# Patient Record
Sex: Female | Born: 1990 | Race: Black or African American | Hispanic: No | Smoking: Never smoker
Health system: Southern US, Community
[De-identification: ages and names within clinical notes are randomized; demographics above are authoritative.]

## PROBLEM LIST (undated history)

## (undated) ENCOUNTER — Emergency Department: Discharge status: Absent without leave | Payer: Self-pay

## (undated) DIAGNOSIS — F419 Anxiety disorder, unspecified: Secondary | ICD-10-CM

## (undated) DIAGNOSIS — O24419 Gestational diabetes mellitus in pregnancy, unspecified control: Secondary | ICD-10-CM

## (undated) DIAGNOSIS — S42309A Unspecified fracture of shaft of humerus, unspecified arm, initial encounter for closed fracture: Secondary | ICD-10-CM

## (undated) DIAGNOSIS — I10 Essential (primary) hypertension: Secondary | ICD-10-CM

## (undated) DIAGNOSIS — N979 Female infertility, unspecified: Secondary | ICD-10-CM

## (undated) DIAGNOSIS — F32A Depression, unspecified: Secondary | ICD-10-CM

## (undated) DIAGNOSIS — E282 Polycystic ovarian syndrome: Secondary | ICD-10-CM

## (undated) HISTORY — DX: Gestational diabetes mellitus in pregnancy, unspecified control: O24.419

## (undated) HISTORY — DX: Essential (primary) hypertension: I10

## (undated) HISTORY — DX: Unspecified fracture of shaft of humerus, unspecified arm, initial encounter for closed fracture: S42.309A

## (undated) HISTORY — DX: Anxiety disorder, unspecified: F41.9

## (undated) HISTORY — PX: NO PAST SURGERIES: SHX2092

## (undated) HISTORY — DX: Depression, unspecified: F32.A

---
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
## 2011-02-07 NOTE — Assessment & Plan Note (Signed)
[name], [name]:  [id number]   MEDICAL RECORD NO.:  [id number]          PATIENT TYPE:  POB   LOCATION:  [facility] [place]         FACILITY:  [facility]   PHYSICIAN:  Caren Griffins, CNM       DATE OF BIRTH:  [birth year]   DATE OF SERVICE:  08/08/2010                                  CLINIC NOTE   REASON FOR VISIT:  Here for refill of her birth control pills.   HISTORY:  This is an 20 year old nulliparous AA female who does not have  a primary care Alyssa Sparks other than [facility] and is in need  of a refill on Ortho Tri-Cyclen.  She is happy with the and Ortho Tri-  Cyclen which she is using for contraception as well as to regulate her  periods and discussing her weight and borderline blood pressure, she  states 203 is the most she has ever weighed, and she would like to get  back to her more normal weight of 180 pounds.  For this reason, she has  joined a weekly exercise class at her church and is walking as much as  possible with her mother.  She states her blood pressure has never been  elevated in the past and she has no concerns other than getting her  refill.   ALLERGIES:  None.   MEDICATIONS:  None.   HEALTH CARE MAINTENANCE:  She has had the usual childhood immunizations  and does get dental care.   Her menstrual history 9 x irregular x 5 light flow, moderate  dysmenorrhea.  She gives a history of initially having monthly cycles  and then for several years having menses either every other month or  every 3 months.  For this reason, she was started on Ortho Cyclen.  She  has had 2 partners in the past years, never been on any other kind of  birth control.   GYN HISTORY:  Never had a Pap, never had an STD.   PERSONAL MEDICAL HISTORY:  Completely negative.  Denies hospitalizations  or significant illnesses.  She has never had surgery.  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  She lives with her mother.  She is a  nonsmoker.  Does not drink alcohol or use any drugs.  No  history of IV drug use or physical abuse.   FAMILY HISTORY:  Positive for mother and both maternal grandparents with  diabetes and as well as high blood pressure.  Her great grandmother had  breast cancer, and her grandmother has had blood clots in the legs.   REVIEW OF SYSTEMS:  Completely negative other than her weight gain.   PHYSICAL EXAMINATION:  VITAL SIGNS:  BP 130/86, pulse 91, weight 203,  height 5 feet 7 inches.  GENERAL:  Pleasant WN, WD, in NAD.  HEAD:  Normocephalic.  Good dentition.  NECK:  Thyroid normal size and shape, smooth.  HEART:  RRR without murmur.  LUNGS:  CTA bilateral.  BREASTS:  Very large.  No discrete mass, but exam is difficult.  No  lymphadenopathy.  ABDOMEN:  Protuberant, no tenderness or masses noted.  EXTREMITIES:  Pulses full and equal bilateral.  No edema.  SKIN:  No lesions noted.  PELVIC:  Deferred.   LABORATORY DATA:  We will check GC and Chlamydia on the urine.   ASSESSMENT AND PLAN.:  1. Obesity.  Discussed weight loss goals and to avoid any empty      calorie foods.  To eat a prudent diet.  We went over the need for      regularity in her exercise program.  2. Borderline blood pressure.  We will bring her back in 3 months      after she starts her Ortho Cyclen again and recheck her blood      pressure at that time.  3. Contraception.  Today, we will refill her Ortho Cyclen, as she is      happy with that and she is advised to abstain from sex for 2 weeks      then get a pregnancy test and if that is negative she can go ahead      and begin contraceptives.  Also discussed safe sex and in addition      advised her to continue using the seatbelt every time she is in a      car.           ______________________________  Caren Griffins, CNM     DP/MEDQ  D:  08/08/2010  T:  08/09/2010  Job:  [id number]

## 2011-09-09 ENCOUNTER — Emergency Department
Admission: EM | Admit: 2011-09-09 | Discharge: 2011-09-09 | Disposition: A | Discharge status: Home / Self Care | Payer: BC Managed Care – PPO | Source: Home / Self Care | Attending: Emergency Medicine | Admitting: Emergency Medicine

## 2011-09-09 NOTE — ED Notes (Signed)
WAS NOT SEEN

## 2011-09-09 NOTE — ED Provider Notes (Signed)
History     CSN: [id number] Arrival date & time: No admission date for patient encounter.   First MD Initiated Contact with Patient 09/09/11 1516      No chief complaint on file.   (Consider location/radiation/quality/duration/timing/severity/associated sxs/prior treatment) HPI  No past medical history on file.  No past surgical history on file.  No family history on file.  History  Substance Use Topics  . Smoking status: Not on file  . Smokeless tobacco: Not on file  . Alcohol Use: Not on file    OB History    No data available      Review of Systems  Allergies  Review of patient's allergies indicates not on file.  Home Medications  No current outpatient prescriptions on file.  There were no vitals taken for this visit.  Physical Exam  ED Course  Procedures (including critical care time)  Labs Reviewed - No data to display No results found.   No diagnosis found.    MDM   Patient left prior to being seen.  Lily Kocher, MD 09/09/11 [phone]

## 2012-01-24 ENCOUNTER — Encounter: Payer: Self-pay | Admitting: Obstetrics & Gynecology

## 2012-01-24 ENCOUNTER — Ambulatory Visit (INDEPENDENT_AMBULATORY_CARE_PROVIDER_SITE_OTHER): Payer: BC Managed Care – PPO | Admitting: Obstetrics & Gynecology

## 2012-01-24 VITALS — BP 118/76 | HR 69 | Temp 97.3°F | Ht 67.0 in | Wt 216.1 lb

## 2012-01-24 DIAGNOSIS — N920 Excessive and frequent menstruation with regular cycle: Secondary | ICD-10-CM

## 2012-01-24 DIAGNOSIS — N921 Excessive and frequent menstruation with irregular cycle: Secondary | ICD-10-CM | POA: Insufficient documentation

## 2012-01-24 LAB — POCT URINE PREGNANCY: Preg Test, Ur: NEGATIVE

## 2012-01-24 MED ORDER — NORGESTIMATE-ETH ESTRADIOL 0.25-35 MG-MCG PO TABS: 1.0000 | ORAL_TABLET | Freq: Every day | ORAL | Status: DC

## 2012-01-24 MED ORDER — MEDROXYPROGESTERONE ACETATE 10 MG PO TABS: 10.0000 mg | ORAL_TABLET | Freq: Every day | ORAL | Status: DC

## 2012-01-24 NOTE — Patient Instructions (Signed)
Oral Contraception Use Oral contraceptives (OCs) are medicines taken to prevent pregnancy. OCs work by preventing the ovaries from releasing eggs. The hormones in OCs also cause the cervical mucus to thicken, preventing the sperm from entering the uterus. The hormones also cause the uterine lining to become thin, not allowing a fertilized egg to attach to the inside of the uterus. OCs are highly effective when taken exactly as prescribed. However, OCs do not prevent sexually transmitted diseases (STDs). Safe sex practices, such as using condoms along with an OC, can help prevent STDs.  Before taking OCs, you may have a physical exam and Pap test. Your caregiver may also order blood tests if necessary. Your caregiver will make sure you are a good candidate for oral contraception. Discuss with your caregiver the possible side effects of the OC you may be prescribed. When starting an OC, it can take 2 to 3 months for the body to adjust to the changes in hormone levels in your body.  HOW TO TAKE ORAL CONTRACEPTIVES Your caregiver may advise you on how to start taking the first cycle of OCs. Otherwise, you can:  Start on day 1 of your menstrual period. You will not need any backup contraceptive protection with this start time.   Start on the first Sunday after your menstrual period or the day you get your prescription. In these cases, you will need to use backup contraceptive protection for the first 7-day cycle.  After you have started taking OCs:  If you forget to take 1 pill, take it as soon as you remember. Take the next pill at the regular time.   If you miss 2 or more pills, use backup birth control until your next menstrual period starts.   If you use a 28-day pack that contains inactive pills and you miss 1 of the last 7 pills (pills with no hormones), it will not matter. Throw away the rest of the non-hormone pills and start a new pill pack.  No matter which day you start the OC, you will always  start a new pack on that same day of the week. Have an extra pack of OCs and a backup contraceptive method available in case you miss some pills or lose your OC pack. HOME CARE INSTRUCTIONS   Do not smoke.   Always use a condom to protect against STDs. OCs do not protect against STDs.   Use a calendar to mark your menstrual period days.   Read the information and directions that come with your OC. Talk to your caregiver if you have questions.  SEEK MEDICAL CARE IF:   You develop nausea and vomiting.   You have abnormal vaginal discharge or bleeding.   You develop a rash.   You miss your menstrual period.   You are losing your hair.   You need treatment for mood swings or depression.   You get dizzy when taking the OC.   You develop acne from taking the OC.   You become pregnant.  SEEK IMMEDIATE MEDICAL CARE IF:   You develop chest pain.   You develop shortness of breath.   You have an uncontrolled or severe headache.   You develop numbness or slurred speech.   You develop visual problems.   You develop pain, redness, and swelling in the legs.  Document Released: 08/31/2011 Document Reviewed: 08/29/2011 Exit[employer] Patient Information 2012 [employer]. 

## 2012-01-25 NOTE — Progress Notes (Signed)
  Subjective:     Alyssa Sparks is a 21 y.o. woman who presents for irregular menses. Patient's last menstrual period was 12/31/2011. Marland Kitchen Periods are irregular, lasting 7-20 days. Dysmenorrhea:none. Cyclic symptoms include: none. Current contraception: none.History of infertility: no. History of abnormal Pap smear: no.  The following portions of the patient's history were reviewed and updated as appropriate: allergies, current medications, past family history, past medical history, past social history, past surgical history and problem list.  Review of Systems A comprehensive review of systems was negative except for: concerned about weight.       Objective:    No exam performed today, No exam indicated.  Pt had recent pelvic US.  Pt no longer taking OCPs and she began to have irregular cycles .    Assessment:    The patient has metrorrhagia (throughout cycle).    Plan:    Diagnosis explained in detail. All questions answered. Agricultural engineer distributed. Pregnancy test, result: negative Restart OCPs  after 10 days of Provera and withdrawal bleed.  (US shows thick linicing at 13 mm) Discussed nuva ring, depo provera, and nexplanon. Pt encouraged to lose weight and follow up with PCP for pre diabetes.

## 2012-03-04 ENCOUNTER — Ambulatory Visit (INDEPENDENT_AMBULATORY_CARE_PROVIDER_SITE_OTHER): Payer: BC Managed Care – PPO | Admitting: Obstetrics and Gynecology

## 2012-03-04 ENCOUNTER — Encounter: Payer: Self-pay | Admitting: Obstetrics and Gynecology

## 2012-03-04 VITALS — BP 120/74 | HR 80 | Ht 66.0 in | Wt 210.0 lb

## 2012-03-04 DIAGNOSIS — N949 Unspecified condition associated with female genital organs and menstrual cycle: Secondary | ICD-10-CM

## 2012-03-04 DIAGNOSIS — N920 Excessive and frequent menstruation with regular cycle: Secondary | ICD-10-CM

## 2012-03-04 DIAGNOSIS — R102 Pelvic and perineal pain: Secondary | ICD-10-CM

## 2012-03-04 DIAGNOSIS — N926 Irregular menstruation, unspecified: Secondary | ICD-10-CM

## 2012-03-04 LAB — POCT URINALYSIS DIPSTICK
Glucose, UA: NEGATIVE
Nitrite, UA: NEGATIVE
Spec Grav, UA: >=1.03
Urobilinogen, UA: NEGATIVE

## 2012-03-04 LAB — POCT URINE PREGNANCY: Preg Test, Ur: NEGATIVE

## 2012-03-04 NOTE — Progress Notes (Signed)
Regular Periods: yes;THEY JUST LAST LONG;WAS TOLD PT HAS THICK ENDOMETRIUM Mammogram: no  Monthly Breast Ex.: yes Exercise: yes  Tetanus < 10 years: yes Seatbelts: yes  NI. Bladder Functn.: yes Abuse at home: no  Daily BM's: yes Stressful Work: yes  Healthy Diet: yes Sigmoid-Colonoscopy: NO  Calcium: no Medical problems this year: CYCLE PROBLEMS   LAST PAP:2012 NL  Contraception: CONDOMS  Mammogram:  NO  PCP: DR. KOSKINAS  PMH:NO CHANGE  FMH: NO CHANGE  Last Bone Scan: NO

## 2012-03-04 NOTE — Progress Notes (Signed)
When did bleeding start: FEB. 14 How  Long: OFF AND ON;STOP COUPLE OF DAYS AND THEN LAST A COUPLE OF WEEKS How often changing pad/tampon: EVERY COUPLE OF HOURS;LAST WEEK;EVERY HOUR Bleeding Disorders: no Contraception: no WAS ON OTC;STOP ON HER OWN Fibroids: no Hormone Therapy: no New Medications: no Menopausal Symptoms: no Vag. Discharge: no Abdominal Pain: yes Increased Stress: yes

## 2012-03-04 NOTE — Progress Notes (Signed)
20 YO with a history of prolonged bleeding starting 11/09/2011 off and on.  May last weeks at a time with pad change every 1-2 hours. Also reports cramping 5/10 relieved with Tylenol or Ibuprofen.  When patient was not on contraceptives, she has skipped up to 2 months having her period.  Had an ultrasound of the pelvis at [facility] with normal findings except 13 mm endometrium.  Took 10 days of Provera 10 mg then began Ortho Cyclen with heavier bleeding.  Tooke the Ortho Cyclen x 2.5 weeks then stopped them.  Bleeding stopped last week (since June 3rd).  Still has back pain and cramping 3/10.  Cramping is worse with lifting.   Lastly patient wants to be considered for a breast reduction. Has been wearing a triple D cup for many years. Complains of upper and lower back pain that limits her ability at work. Patient has to take Tylenol and/or Ibuprofen every morning when she leaves work.  O: Neck: supple, no masses or thyromegaly          Lungs: clear      Heart: regular rate and rhythm       Abdomen: soft, non-tender       Pelvic: EGBUS-wnl, vagina-normal, cervix-no lesions,       uterus-normal size (exam limited by habitus), adnexae-      no tenderness   A: Menorrhagia     H/O Oligomenorrhea     Symptomatic Pendulous Breast  P: ROI lab results-[facility]      If not done, will do fasting insulin, glucose, TSH     and CBC      RTO- as scheduled or prn

## 2012-04-02 ENCOUNTER — Encounter: Payer: Self-pay | Admitting: *Deleted

## 2012-04-02 ENCOUNTER — Emergency Department
Admission: EM | Admit: 2012-04-02 | Discharge: 2012-04-02 | Disposition: A | Discharge status: Home / Self Care | Payer: BC Managed Care – PPO | Source: Home / Self Care

## 2012-04-02 DIAGNOSIS — S0500XA Injury of conjunctiva and corneal abrasion without foreign body, unspecified eye, initial encounter: Secondary | ICD-10-CM

## 2012-04-02 DIAGNOSIS — S058X9A Other injuries of unspecified eye and orbit, initial encounter: Secondary | ICD-10-CM

## 2012-04-02 MED ORDER — OFLOXACIN 0.3 % OP SOLN: 1.0000 [drp] | Freq: Four times a day (QID) | OPHTHALMIC | Status: AC

## 2012-04-02 NOTE — ED Notes (Signed)
Pt c/o bilateral eye pain and watering x 2 hours, after waking up from a nap with her contacts in her eyes. Denies fever.
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
## 2012-04-05 NOTE — ED Provider Notes (Signed)
Agree with exam, assessment, and plan.   Lattie Haw, MD 04/05/12 [phone]

## 2012-04-26 ENCOUNTER — Emergency Department (HOSPITAL_COMMUNITY)
Admission: EM | Admit: 2012-04-26 | Discharge: 2012-04-26 | Disposition: A | Discharge status: Home / Self Care | Payer: No Typology Code available for payment source | Attending: Emergency Medicine | Admitting: Emergency Medicine

## 2012-04-26 ENCOUNTER — Encounter (HOSPITAL_COMMUNITY): Payer: Self-pay | Admitting: Emergency Medicine

## 2012-04-26 ENCOUNTER — Emergency Department (HOSPITAL_COMMUNITY): Payer: No Typology Code available for payment source

## 2012-04-26 DIAGNOSIS — Y92410 Unspecified street and highway as the place of occurrence of the external cause: Secondary | ICD-10-CM | POA: Insufficient documentation

## 2012-04-26 DIAGNOSIS — M25559 Pain in unspecified hip: Secondary | ICD-10-CM | POA: Insufficient documentation

## 2012-04-26 DIAGNOSIS — M542 Cervicalgia: Secondary | ICD-10-CM | POA: Insufficient documentation

## 2012-04-26 MED ORDER — ACETAMINOPHEN 500 MG PO TABS
1000.0000 mg | ORAL_TABLET | Freq: Once | ORAL | Status: AC
  Administered 2012-04-26: 1000 mg via ORAL
  Filled 2012-04-26: qty 2
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
## 2012-05-30 ENCOUNTER — Encounter: Payer: Self-pay | Admitting: Obstetrics and Gynecology

## 2012-05-30 ENCOUNTER — Ambulatory Visit (INDEPENDENT_AMBULATORY_CARE_PROVIDER_SITE_OTHER): Payer: BC Managed Care – PPO | Admitting: Obstetrics and Gynecology

## 2012-05-30 VITALS — BP 120/70 | HR 74 | Wt 214.0 lb

## 2012-05-30 DIAGNOSIS — N912 Amenorrhea, unspecified: Secondary | ICD-10-CM

## 2012-05-30 DIAGNOSIS — N915 Oligomenorrhea, unspecified: Secondary | ICD-10-CM

## 2012-05-30 DIAGNOSIS — N920 Excessive and frequent menstruation with regular cycle: Secondary | ICD-10-CM

## 2012-05-30 LAB — POCT URINE PREGNANCY: Preg Test, Ur: NEGATIVE

## 2012-05-30 MED ORDER — MEDROXYPROGESTERONE ACETATE 10 MG PO TABS: 10.0000 mg | ORAL_TABLET | Freq: Every day | ORAL | Status: AC
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
## 2012-06-04 ENCOUNTER — Other Ambulatory Visit: Payer: BC Managed Care – PPO

## 2012-06-04 DIAGNOSIS — N912 Amenorrhea, unspecified: Secondary | ICD-10-CM

## 2012-06-04 LAB — PROLACTIN: Prolactin: 11.4 ng/mL

## 2012-06-04 LAB — GLUCOSE, RANDOM: Glucose, Bld: 86 mg/dL (ref 70–99)

## 2012-06-04 LAB — TSH: TSH: 1.415 u[IU]/mL (ref 0.350–4.500)

## 2012-06-05 ENCOUNTER — Telehealth: Payer: Self-pay | Admitting: Obstetrics and Gynecology

## 2012-06-05 NOTE — Telephone Encounter (Signed)
TC to pt. LM to return call.  

## 2012-06-06 ENCOUNTER — Telehealth: Payer: Self-pay

## 2012-06-06 LAB — INSULIN, FASTING: Insulin fasting, serum: 47 u[IU]/mL — ABNORMAL HIGH (ref 3–28)

## 2012-06-06 NOTE — Telephone Encounter (Signed)
Message copied by Winfred Leeds on Thu Jun 06, 2012  8:39 AM ------      Message from: Henreitta Leber      Created: Wed Jun 05, 2012  2:59 PM       Advise patient of normal thyroid and glucose tests however insulin level is high and glucose/insulin ratio low indicating that she is insulin resistant. She may schedule to further discuss management as this directly impacts her irregular bleeding and can cause weight gain.  Thanks,  EP

## 2012-06-06 NOTE — Telephone Encounter (Signed)
TC TO PT REGARDING LAB TEST THAT ARE MENTION BELOW. INFORMED PT ABOUT INSULIN LEVEL INDICATING THAT SHE IS INSULIN RESISTANT AND SCHEDULED AN APPT ON 06/10/12 AT 9:00 AM TO FURTHER DISCUSS. PT VOICED UNDERSTANDING.

## 2012-06-10 ENCOUNTER — Encounter: Payer: Self-pay | Admitting: Obstetrics and Gynecology

## 2012-06-10 ENCOUNTER — Ambulatory Visit (INDEPENDENT_AMBULATORY_CARE_PROVIDER_SITE_OTHER): Payer: BC Managed Care – PPO | Admitting: Obstetrics and Gynecology

## 2012-06-10 VITALS — BP 114/70 | HR 74 | Wt 219.0 lb

## 2012-06-10 DIAGNOSIS — N912 Amenorrhea, unspecified: Secondary | ICD-10-CM

## 2012-06-10 DIAGNOSIS — E8881 Metabolic syndrome: Secondary | ICD-10-CM

## 2012-06-10 MED ORDER — METFORMIN HCL 500 MG PO TABS: ORAL_TABLET | ORAL | Status: DC

## 2012-06-10 NOTE — Progress Notes (Signed)
21 YO with a history of menometrorrhagia and recent labs showing insulin resistance returns to discuss treatment.   A: Insulin Resistance     Amenorrhea/Oligomenorrhea/Menometrorrhagis     P: Reviewed management options to include: cyclical progestins,  BCPs, Nutritional counseling for dietary modification with exercise and Metformin      Patient chooses to try Metformin.  Aware of possible GI upset       Advised Low CHO diet and ask chiropractor for exercises that won't injure back      Call if no menses in 10 days (just completed Provera)       RTO-3 months for repeat labs and follow up

## 2012-10-10 ENCOUNTER — Encounter (HOSPITAL_BASED_OUTPATIENT_CLINIC_OR_DEPARTMENT_OTHER): Payer: Self-pay | Admitting: *Deleted

## 2012-10-10 ENCOUNTER — Emergency Department (HOSPITAL_BASED_OUTPATIENT_CLINIC_OR_DEPARTMENT_OTHER): Payer: BC Managed Care – PPO

## 2012-10-10 ENCOUNTER — Emergency Department (HOSPITAL_BASED_OUTPATIENT_CLINIC_OR_DEPARTMENT_OTHER)
Admission: EM | Admit: 2012-10-10 | Discharge: 2012-10-10 | Disposition: A | Discharge status: Home / Self Care | Payer: BC Managed Care – PPO | Attending: Emergency Medicine | Admitting: Emergency Medicine

## 2012-10-10 DIAGNOSIS — Z8742 Personal history of other diseases of the female genital tract: Secondary | ICD-10-CM | POA: Insufficient documentation

## 2012-10-10 DIAGNOSIS — Z79899 Other long term (current) drug therapy: Secondary | ICD-10-CM | POA: Insufficient documentation

## 2012-10-10 DIAGNOSIS — Z8781 Personal history of (healed) traumatic fracture: Secondary | ICD-10-CM | POA: Insufficient documentation

## 2012-10-10 DIAGNOSIS — Z3202 Encounter for pregnancy test, result negative: Secondary | ICD-10-CM | POA: Insufficient documentation

## 2012-10-10 DIAGNOSIS — R0789 Other chest pain: Secondary | ICD-10-CM

## 2012-10-10 DIAGNOSIS — R071 Chest pain on breathing: Secondary | ICD-10-CM | POA: Insufficient documentation

## 2012-10-10 HISTORY — DX: Polycystic ovarian syndrome: E28.2

## 2012-10-10 LAB — URINALYSIS, ROUTINE W REFLEX MICROSCOPIC
Bilirubin Urine: NEGATIVE
Hgb urine dipstick: NEGATIVE
Protein, ur: NEGATIVE mg/dL
Urobilinogen, UA: 1 mg/dL (ref 0.0–1.0)

## 2012-10-10 MED ORDER — NAPROXEN SODIUM 550 MG PO TABS: 500.0000 mg | ORAL_TABLET | Freq: Two times a day (BID) | ORAL | Status: DC
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
## 2012-10-10 NOTE — ED Notes (Signed)
Pain across her upper chest for a week on and off. No cold symptoms.
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
## 2015-10-14 ENCOUNTER — Encounter: Payer: Self-pay | Admitting: Family Medicine

## 2015-10-14 ENCOUNTER — Ambulatory Visit (INDEPENDENT_AMBULATORY_CARE_PROVIDER_SITE_OTHER): Payer: BLUE CROSS/BLUE SHIELD | Admitting: Family Medicine

## 2015-10-14 VITALS — BP 120/65 | HR 85 | Ht 67.0 in | Wt 235.0 lb

## 2015-10-14 DIAGNOSIS — R319 Hematuria, unspecified: Secondary | ICD-10-CM | POA: Diagnosis not present

## 2015-10-14 DIAGNOSIS — R350 Frequency of micturition: Secondary | ICD-10-CM

## 2015-10-14 DIAGNOSIS — M546 Pain in thoracic spine: Secondary | ICD-10-CM

## 2015-10-14 DIAGNOSIS — N926 Irregular menstruation, unspecified: Secondary | ICD-10-CM

## 2015-10-14 DIAGNOSIS — N62 Hypertrophy of breast: Secondary | ICD-10-CM

## 2015-10-14 DIAGNOSIS — E282 Polycystic ovarian syndrome: Secondary | ICD-10-CM | POA: Diagnosis not present

## 2015-10-14 DIAGNOSIS — M549 Dorsalgia, unspecified: Secondary | ICD-10-CM

## 2015-10-14 HISTORY — DX: Polycystic ovarian syndrome: E28.2

## 2015-10-14 LAB — POCT URINALYSIS DIPSTICK
BILIRUBIN UA: NEGATIVE
Glucose, UA: NEGATIVE
Ketones, UA: NEGATIVE
Leukocytes, UA: NEGATIVE
NITRITE UA: NEGATIVE
Protein, UA: NEGATIVE
UROBILINOGEN UA: 0.2
pH, UA: 6

## 2015-10-14 LAB — POCT GLYCOSYLATED HEMOGLOBIN (HGB A1C): HEMOGLOBIN A1C: 5.5

## 2015-10-14 LAB — POCT URINE PREGNANCY: PREG TEST UR: NEGATIVE
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
## 2015-10-15 LAB — URINALYSIS, MICROSCOPIC ONLY
Bacteria, UA: NONE SEEN [HPF]
CRYSTALS: NONE SEEN [HPF]
Casts: NONE SEEN [LPF]
Yeast: NONE SEEN [HPF]

## 2015-10-16 LAB — URINE CULTURE

## 2016-06-02 ENCOUNTER — Encounter: Payer: Self-pay | Admitting: Family Medicine

## 2016-06-02 ENCOUNTER — Ambulatory Visit (INDEPENDENT_AMBULATORY_CARE_PROVIDER_SITE_OTHER): Payer: BLUE CROSS/BLUE SHIELD | Admitting: Family Medicine

## 2016-06-02 ENCOUNTER — Ambulatory Visit (INDEPENDENT_AMBULATORY_CARE_PROVIDER_SITE_OTHER): Payer: BLUE CROSS/BLUE SHIELD

## 2016-06-02 VITALS — BP 139/79 | HR 85 | Ht 67.0 in | Wt 235.0 lb

## 2016-06-02 DIAGNOSIS — E282 Polycystic ovarian syndrome: Secondary | ICD-10-CM | POA: Diagnosis not present

## 2016-06-02 DIAGNOSIS — N6489 Other specified disorders of breast: Secondary | ICD-10-CM | POA: Diagnosis not present

## 2016-06-02 DIAGNOSIS — M545 Low back pain, unspecified: Secondary | ICD-10-CM

## 2016-06-02 DIAGNOSIS — N62 Hypertrophy of breast: Secondary | ICD-10-CM | POA: Diagnosis not present

## 2016-06-02 DIAGNOSIS — G8929 Other chronic pain: Secondary | ICD-10-CM | POA: Insufficient documentation

## 2016-06-02 HISTORY — DX: Hypertrophy of breast: N62

## 2016-06-02 HISTORY — DX: Low back pain, unspecified: M54.50

## 2016-06-02 MED ORDER — METFORMIN HCL 500 MG PO TABS: ORAL_TABLET | ORAL | 5 refills | Status: DC

## 2016-06-02 NOTE — Progress Notes (Signed)
Subjective:    CC: Low back pain  HPI:  pt reports that she has been experiencing lower back pain for years. She denies any prior injury.  She feels like it is due to her large breasts. She went to be fitted for new bras and her current size is a 40-I. They no longer carry her bra size.  Alyssa Sparks finds herself leaning over more because of this. she was recently measured and told she is 40I. she has indentions from bra straps.  Says it is causing color change wher the traps on rubbing. She's been applying coconut oil to the skin to try to improve it cosmetically. She says her pain is constant. Some days are better than others. She does use Tylenol and Aleve but says it really doesn't provide a lot of relief. 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  PCO S-she would like a refill on her metformin today.  BP 139/79   Pulse 85   Ht 5\' 7"  (1.702 m)   Wt 235 lb (106.6 kg)   SpO2 100%   BMI 36.81 kg/m     No Known Allergies  Past Medical History:  Diagnosis Date  . Broken arm    RIGHT  . Polycystic ovarian disease     No past surgical history on file.  Social History   Social History  . Marital status: Married    Spouse name: N/A  . Number of children: N/A  . Years of education: N/A   Occupational History  . CNA/MED-TECH    Social History Main Topics  . Smoking status: Never Smoker  . Smokeless tobacco: Never Used  . Alcohol use No  . Drug use: No  . Sexual activity: Yes    Birth control/ protection: None     Comment: WAS ON B/C STOP ON HER OWN   Other Topics Concern  . Not on file   Social History Narrative  . No narrative on file    Family History  Problem Relation Age of Onset  . Hypertension Mother   . Diabetes Mother   . Diabetes Maternal Grandmother   . Hypertension Maternal Grandmother   . Deep vein thrombosis Maternal Grandmother   . Diabetes Maternal Grandfather   . Hypertension Maternal Grandfather    . Prostate cancer Maternal Grandfather   . Cancer Maternal Grandfather     PROSTATE  . Breast cancer Other   . Heart attack Father     Outpatient Encounter Prescriptions as of 06/02/2016  Medication Sig  . metFORMIN (GLUCOPHAGE) 500 MG tablet 1 tablet daily x 7 days, then bid x 7 days then tid  . [DISCONTINUED] metFORMIN (GLUCOPHAGE) 500 MG tablet 1 tablet daily x 7 days, then bid x 7 days then tid   No facility-administered encounter medications on file as of 06/02/2016.        Objective:    General: Well Developed, well nourished, and in no acute distress.  Neuro: Alert and oriented x3, extra-ocular muscles intact, sensation grossly intact.  HEENT: Normocephalic, atraumatic  Skin: Warm and dry, no rashes. Cardiac: Regular rate and rhythm, no murmurs rubs or gallops, no lower extremity edema.  Respiratory: Clear to auscultation bilaterally. Not using accessory muscles, speaking in full sentences. Ext: Normal flexion and extension that she did have pain with flexion. Normal rotation right and left. Nontender over the lumbar spine. Nontender over the SI joints. Negative straight leg raise bilaterally. Hip, knee,  ankle strength is 5 out of 5 bilaterally. Patellar reflexes 1+ bilaterally.   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 I do think she would benefit from breast reduction surgery. I think this would make a big difference in her back pain. Will also get x-rays today just for further evaluation. Can discuss treatment for pain as well after we get the x-rays back.  Macromastia-refer for surgical consultation.  PCOS-refilled metformin today.

## 2016-08-05 ENCOUNTER — Emergency Department (INDEPENDENT_AMBULATORY_CARE_PROVIDER_SITE_OTHER)
Admission: EM | Admit: 2016-08-05 | Discharge: 2016-08-05 | Disposition: A | Discharge status: Home / Self Care | Payer: BLUE CROSS/BLUE SHIELD | Source: Home / Self Care | Attending: Family Medicine | Admitting: Family Medicine

## 2016-08-05 ENCOUNTER — Encounter: Payer: Self-pay | Admitting: Emergency Medicine

## 2016-08-05 DIAGNOSIS — K1121 Acute sialoadenitis: Secondary | ICD-10-CM

## 2016-08-05 MED ORDER — AMOXICILLIN-POT CLAVULANATE 875-125 MG PO TABS: 1.0000 | ORAL_TABLET | Freq: Two times a day (BID) | ORAL | 0 refills | Status: DC
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
## 2016-08-05 NOTE — Discharge Instructions (Signed)
May take Ibuprofen 200mg , 4 tabs every 8 hours with food.   Apply warm compress several times daily.  Eat sour candy to increase your saliva production.  If symptoms become significantly worse during the night or over the weekend, proceed to the local emergency room.

## 2016-08-05 NOTE — ED Provider Notes (Signed)
[facility]    CSN: [id number] Arrival date & time: 08/05/16  1635     History   Chief Complaint Chief Complaint  Patient presents with  . Otalgia    HPI Alyssa Sparks is a 25 y.o. female.   Patient states that she went to a [place] race about two weeks and used ear plugs.  The next day she had soreness in her right ear, and then developed sinus congestion and a cough.  During the past 4 to 5 nights she has had shooting pain in her right ear.   The history is provided by the patient.    Past Medical History:  Diagnosis Date  . Broken arm    RIGHT  . Polycystic ovarian disease     Patient Active Problem List   Diagnosis Date Noted  . Pendulous breast 06/02/2016  . Macromastia 06/02/2016  . Chronic bilateral low back pain without sciatica 06/02/2016  . PCOS (polycystic ovarian syndrome) 10/14/2015  . Menometrorrhagia 01/24/2012    History reviewed. No pertinent surgical history.  OB History    Gravida Para Term Preterm AB Living   0 0       0   SAB TAB Ectopic Multiple Live Births                   Home Medications    Prior to Admission medications   Medication Sig Start Date End Date Taking? Authorizing Provider  amoxicillin-clavulanate (AUGMENTIN) 875-125 MG tablet Take 1 tablet by mouth 2 (two) times daily. Take with food 08/05/16   Lattie HawStephen A Meryle Pugmire, MD  metFORMIN (GLUCOPHAGE) 500 MG tablet 1 tablet daily x 7 days, then bid x 7 days then tid 06/02/16   Agapito Gamesatherine D Metheney, MD    Family History Family History  Problem Relation Age of Onset  . Breast cancer Other   . Hypertension Mother   . Diabetes Mother   . Heart attack Father   . Diabetes Maternal Grandmother   . Hypertension Maternal Grandmother   . Deep vein thrombosis Maternal Grandmother   . Diabetes Maternal Grandfather   . Hypertension Maternal Grandfather   . Prostate cancer Maternal Grandfather   . Cancer Maternal Grandfather     PROSTATE    Social History Social  History  Substance Use Topics  . Smoking status: Never Smoker  . Smokeless tobacco: Never Used  . Alcohol use No     Allergies   Patient has no known allergies.   Review of Systems Review of Systems  No sore throat + cough No pleuritic pain No wheezing + nasal congestion ? post-nasal drainage No sinus pain/pressure No itchy/red eyes ? right earache No hemoptysis No SOB No fever/chills No nausea No vomiting No abdominal pain No diarrhea No urinary symptoms No skin rash + fatigue No myalgias + headache Used OTC meds without relief    Physical Exam Triage Vital Signs ED Triage Vitals  Enc Vitals Group     BP 08/05/16 1659 128/84     Pulse Rate 08/05/16 1659 85     Resp --      Temp 08/05/16 1659 98.5 F (36.9 C)     Temp Source 08/05/16 1659 Oral     SpO2 08/05/16 1659 97 %     Weight 08/05/16 1700 233 lb 4 oz (105.8 kg)     Height 08/05/16 1700 5\' 7"  (1.702 m)     Head Circumference --      Peak Flow --  Pain Score 08/05/16 1701 8     Pain Loc --      Pain Edu? --      Excl. in GC? --    No data found.   Updated Vital Signs BP 128/84 (BP Location: Left Arm)   Pulse 85   Temp 98.5 F (36.9 C) (Oral)   Ht 5\' 7"  (1.702 m)   Wt 233 lb 4 oz (105.8 kg)   LMP 07/09/2016   SpO2 97%   BMI 36.53 kg/m   Visual Acuity Right Eye Distance:   Left Eye Distance:   Bilateral Distance:    Right Eye Near:   Left Eye Near:    Bilateral Near:     Physical Exam  Constitutional: She appears well-developed and well-nourished. No distress.  HENT:  Head: Normocephalic.    Right Ear: External ear normal.  Left Ear: External ear normal.  Nose: Nose normal.  Mouth/Throat: Oropharynx is clear and moist.  Distinct tenderness to palpation over right parotid gland although swelling is not present.  Mild tenderness also over right TMJ  Eyes: Conjunctivae and EOM are normal. Pupils are equal, round, and reactive to light. Right eye exhibits no discharge.  Left eye exhibits no discharge.  Neck: Neck supple.  Tender shotty right lateral cervical nodes.  Cardiovascular: Normal heart sounds.   Pulmonary/Chest: Breath sounds normal.  Abdominal: There is no tenderness.  Musculoskeletal: She exhibits no edema.  Lymphadenopathy:    She has no cervical adenopathy.  Neurological: She is alert.  Skin: Skin is warm and dry. No rash noted.  Nursing note and vitals reviewed.    UC Treatments / Results  Labs (all labs ordered are listed, but only abnormal results are displayed) Labs Reviewed - No data to display  EKG  EKG Interpretation None       Radiology No results found.  Procedures Procedures (including critical care time)  Medications Ordered in UC Medications - No data to display   Initial Impression / Assessment and Plan / UC Course  I have reviewed the triage vital signs and the nursing notes.  Pertinent labs & imaging results that were available during my care of the patient were reviewed by me and considered in my medical decision making (see chart for details).  Clinical Course   Begin Augmentin. May take Ibuprofen 200mg , 4 tabs every 8 hours with food.   Apply warm compress several times daily.  Eat sour candy to increase your saliva production.  If symptoms become significantly worse during the night or over the weekend, proceed to the local emergency room.  Followup with ENT if not improved in 6 days.     Final Clinical Impressions(s) / UC Diagnoses   Final diagnoses:  Parotitis, acute    New Prescriptions New Prescriptions   AMOXICILLIN-CLAVULANATE (AUGMENTIN) 875-125 MG TABLET    Take 1 tablet by mouth 2 (two) times daily. Take with food     Lattie HawStephen A Icesis Renn, MD 08/08/16 [phone]

## 2016-10-02 ENCOUNTER — Ambulatory Visit (INDEPENDENT_AMBULATORY_CARE_PROVIDER_SITE_OTHER): Payer: BLUE CROSS/BLUE SHIELD | Admitting: Family Medicine

## 2016-10-02 ENCOUNTER — Encounter: Payer: Self-pay | Admitting: Family Medicine

## 2016-10-02 VITALS — BP 126/73 | HR 74 | Ht 67.0 in | Wt 223.0 lb

## 2016-10-02 DIAGNOSIS — H539 Unspecified visual disturbance: Secondary | ICD-10-CM

## 2016-10-02 DIAGNOSIS — E282 Polycystic ovarian syndrome: Secondary | ICD-10-CM

## 2016-10-02 DIAGNOSIS — H538 Other visual disturbances: Secondary | ICD-10-CM | POA: Diagnosis not present

## 2016-10-02 DIAGNOSIS — R51 Headache: Secondary | ICD-10-CM | POA: Diagnosis not present

## 2016-10-02 DIAGNOSIS — R519 Headache, unspecified: Secondary | ICD-10-CM

## 2016-10-02 LAB — POCT GLYCOSYLATED HEMOGLOBIN (HGB A1C): Hemoglobin A1C: 5.1

## 2016-10-02 MED ORDER — METFORMIN HCL 500 MG PO TABS: 500.0000 mg | ORAL_TABLET | Freq: Three times a day (TID) | ORAL | 5 refills | Status: DC

## 2016-10-02 NOTE — Progress Notes (Signed)
Subjective:    CC: HA  HPI:  HA - w/blurred vision affecting R eye only x 3weeks. she has been taking aleve and excedrin for relief.  She says the headache is over and around her right eye and up onto her frontal area. She says that she is having blurry vision in that eye.  Unsure of last eye exam. she reports that when she drives she sees sprarkles in her R eye only. the HA is only affecting her R side.  She's been under a lot of personal stress recently so thought it was just from stress but it seems to be gradually getting worse so she came in today. Fevers chills or sweats.  She does wear lenses. No family history of any eye disorders such as glaucoma etc.   Visual Acuity Screening   Right eye Left eye Both eyes  Without correction:     With correction: 20/50 20/20 20/20      PCOS - no increased thirst or urination. Currently on metformin 500 mg.  BP 126/73   Pulse 74   Ht 5\' 7"  (1.702 m)   Wt 223 lb (101.2 kg)   SpO2 99%   BMI 34.93 kg/m     No Known Allergies  Past Medical History:  Diagnosis Date  . Broken arm    RIGHT  . Polycystic ovarian disease     No past surgical history on file.  Social History   Social History  . Marital status: Married    Spouse name: N/A  . Number of children: N/A  . Years of education: N/A   Occupational History  . CNA/MED-TECH    Social History Main Topics  . Smoking status: Never Smoker  . Smokeless tobacco: Never Used  . Alcohol use No  . Drug use: No  . Sexual activity: Yes    Birth control/ protection: None     Comment: WAS ON B/C STOP ON HER OWN   Other Topics Concern  . Not on file   Social History Narrative  . No narrative on file    Family History  Problem Relation Age of Onset  . Breast cancer Other   . Hypertension Mother   . Diabetes Mother   . Heart attack Father   . Diabetes Maternal Grandmother   . Hypertension Maternal Grandmother   . Deep vein thrombosis Maternal Grandmother   . Diabetes  Maternal Grandfather   . Hypertension Maternal Grandfather   . Prostate cancer Maternal Grandfather   . Cancer Maternal Grandfather     PROSTATE    Outpatient Encounter Prescriptions as of 10/02/2016  Medication Sig  . metFORMIN (GLUCOPHAGE) 500 MG tablet Take 1 tablet (500 mg total) by mouth 3 (three) times daily.  . [DISCONTINUED] metFORMIN (GLUCOPHAGE) 500 MG tablet 1 tablet daily x 7 days, then bid x 7 days then tid  . [DISCONTINUED] amoxicillin-clavulanate (AUGMENTIN) 875-125 MG tablet Take 1 tablet by mouth 2 (two) times daily. Take with food   No facility-administered encounter medications on file as of 10/02/2016.      .   Objective:    General: Well Developed, well nourished, and in no acute distress.  Neuro: Alert and oriented x3, extra-ocular muscles intact, sensation grossly intact.  HEENT: Normocephalic, atraumatic.  Pupils equal round and reactive to light, extraocular movements intact. Though she had some delay with inward motion with accommodation on the right compared to the left. Optic exam reveals swollen blood vessels.  Skin: Warm and dry, no rashes.  Impression and Recommendations:    Abnormal vision-significant vision change between the right and left eyes and she seeing sparkles in the vision. Call to get her and her with optometry. They will see her tomorrow morning. Will give her work note. Will check a CBC and a sedimentation rate today.  PCOS - well controlled. Continue metformin.  Lab Results  Component Value Date   HGBA1C 5.1 10/02/2016

## 2016-10-03 LAB — C-REACTIVE PROTEIN: CRP: 7.8 mg/L (ref <8.0)

## 2016-10-03 LAB — CBC WITH DIFFERENTIAL/PLATELET
Basophils Absolute: 0 cells/uL (ref 0–200)
Basophils Relative: 0 %
Eosinophils Absolute: 75 cells/uL (ref 15–500)
Eosinophils Relative: 1 %
HEMATOCRIT: 38.8 % (ref 35.0–45.0)
Hemoglobin: 12.1 g/dL (ref 11.7–15.5)
LYMPHS PCT: 28 %
Lymphs Abs: 2100 cells/uL (ref 850–3900)
MCH: 29.2 pg (ref 27.0–33.0)
MCHC: 31.2 g/dL — AB (ref 32.0–36.0)
MCV: 93.7 fL (ref 80.0–100.0)
MONO ABS: 300 {cells}/uL (ref 200–950)
MPV: 9.6 fL (ref 7.5–12.5)
Monocytes Relative: 4 %
NEUTROS PCT: 67 %
Neutro Abs: 5025 cells/uL (ref 1500–7800)
PLATELETS: 335 10*3/uL (ref 140–400)
RBC: 4.14 MIL/uL (ref 3.80–5.10)
RDW: 12.2 % (ref 11.0–15.0)
WBC: 7.5 10*3/uL (ref 3.8–10.8)

## 2016-10-03 LAB — SEDIMENTATION RATE: SED RATE: 14 mm/h (ref 0–20)

## 2018-07-31 LAB — HM PAP SMEAR: HM PAP: NEGATIVE

## 2018-09-26 ENCOUNTER — Encounter: Payer: Self-pay | Admitting: Family Medicine

## 2019-02-27 ENCOUNTER — Other Ambulatory Visit: Payer: Self-pay

## 2019-02-27 ENCOUNTER — Other Ambulatory Visit (HOSPITAL_COMMUNITY)
Admission: RE | Admit: 2019-02-27 | Discharge: 2019-02-27 | Disposition: A | Discharge status: Home / Self Care | Payer: Managed Care, Other (non HMO) | Source: Ambulatory Visit | Attending: Family Medicine | Admitting: Family Medicine

## 2019-02-27 ENCOUNTER — Emergency Department (INDEPENDENT_AMBULATORY_CARE_PROVIDER_SITE_OTHER)
Admission: EM | Admit: 2019-02-27 | Discharge: 2019-02-27 | Disposition: A | Discharge status: Home / Self Care | Payer: Managed Care, Other (non HMO) | Source: Home / Self Care

## 2019-02-27 DIAGNOSIS — N760 Acute vaginitis: Secondary | ICD-10-CM

## 2019-02-27 NOTE — ED Provider Notes (Signed)
[facility]    CSN: [id number] Arrival date & time: 02/27/19  0859     History   Chief Complaint Chief Complaint  Patient presents with  . Vaginal Itching    HPI Alyssa Sparks is a 28 y.o. female.   HPI Alyssa StallKelcey Wedeking is a 28 y.o. female presenting to UC with c/o vaginal irritation for about 3-4 days after douching on Sunday 02/23/2019 night.  She has not tried this in the past. No known allergies. Denies dysuria, abdominal pain or back pain. She has normal vaginal discharge c/w early start of her menses.  Denies concern for STIs as she has the same sexual partner but has agreed to be tested for gonorrhea and chlamydia since she needs to give a vaginal sample already. She declined testing for HIV or syphilis.  LMP: she does not have one due to being on continuous birth control, she denies concern for pregnancy.   Past Medical History:  Diagnosis Date  . Broken arm    RIGHT  . Polycystic ovarian disease     Patient Active Problem List   Diagnosis Date Noted  . Blurred vision, right eye 10/02/2016  . Pendulous breast 06/02/2016  . Macromastia 06/02/2016  . Chronic bilateral low back pain without sciatica 06/02/2016  . PCOS (polycystic ovarian syndrome) 10/14/2015  . Menometrorrhagia 01/24/2012    History reviewed. No pertinent surgical history.  OB History    Gravida  0   Para  0   Term      Preterm      AB      Living  0     SAB      TAB      Ectopic      Multiple      Live Births               Home Medications    Prior to Admission medications   Medication Sig Start Date End Date Taking? Authorizing Provider  metFORMIN (GLUCOPHAGE) 500 MG tablet Take 1 tablet (500 mg total) by mouth 3 (three) times daily. 10/02/16   Agapito GamesMetheney, Catherine D, MD    Family History Family History  Problem Relation Age of Onset  . Breast cancer Other   . Hypertension Mother   . Diabetes Mother   . Heart attack Father   . Diabetes Maternal Grandmother    . Hypertension Maternal Grandmother   . Deep vein thrombosis Maternal Grandmother   . Diabetes Maternal Grandfather   . Hypertension Maternal Grandfather   . Prostate cancer Maternal Grandfather   . Cancer Maternal Grandfather        PROSTATE    Social History Social History   Tobacco Use  . Smoking status: Never Smoker  . Smokeless tobacco: Never Used  Substance Use Topics  . Alcohol use: No  . Drug use: No     Allergies   Patient has no known allergies.   Review of Systems Review of Systems  Gastrointestinal: Negative for abdominal pain, nausea and vomiting.  Genitourinary: Positive for vaginal discharge and vaginal pain (irritation/itching). Negative for dysuria, frequency, genital sores, hematuria, pelvic pain and urgency.  Musculoskeletal: Negative for back pain.     Physical Exam Triage Vital Signs ED Triage Vitals [02/27/19 0920]  Enc Vitals Group     BP 128/83     Pulse Rate 97     Resp 20     Temp 97.7 F (36.5 C)     Temp Source Tympanic  SpO2 99 %     Weight 219 lb (99.3 kg)     Height 5\' 7"  (1.702 m)     Head Circumference      Peak Flow      Pain Score 0     Pain Loc      Pain Edu?      Excl. in GC?    No data found.  Updated Vital Signs BP 128/83 (BP Location: Right Arm)   Pulse 97   Temp 97.7 F (36.5 C) (Tympanic)   Resp 20   Ht 5\' 7"  (1.702 m)   Wt 219 lb (99.3 kg)   SpO2 99%   BMI 34.30 kg/m   Visual Acuity Right Eye Distance:   Left Eye Distance:   Bilateral Distance:    Right Eye Near:   Left Eye Near:    Bilateral Near:     Physical Exam Vitals signs and nursing note reviewed.  Constitutional:      Appearance: Normal appearance. She is well-developed.  HENT:     Head: Normocephalic and atraumatic.  Neck:     Musculoskeletal: Normal range of motion.  Cardiovascular:     Rate and Rhythm: Normal rate and regular rhythm.  Pulmonary:     Effort: Pulmonary effort is normal.     Breath sounds: Normal breath  sounds.  Abdominal:     General: There is no distension.     Palpations: Abdomen is soft.     Tenderness: There is no abdominal tenderness. There is no right CVA tenderness or left CVA tenderness.  Genitourinary:    Comments: Deferred. Musculoskeletal: Normal range of motion.  Skin:    General: Skin is warm and dry.  Neurological:     Mental Status: She is alert and oriented to person, place, and time.  Psychiatric:        Behavior: Behavior normal.      UC Treatments / Results  Labs (all labs ordered are listed, but only abnormal results are displayed) Labs Reviewed  CERVICOVAGINAL ANCILLARY ONLY    EKG None  Radiology No results found.  Procedures Procedures (including critical care time)  Medications Ordered in UC Medications - No data to display  Initial Impression / Assessment and Plan / UC Course  I have reviewed the triage vital signs and the nursing notes.  Pertinent labs & imaging results that were available during my care of the patient were reviewed by me and considered in my medical decision making (see chart for details).     Discussed pelvic exam vs self-swab as pt is not having any abdominal pain or bleeding.  Pt requested to perform self-swab. Home care info provided. F/u as needed.   Final Clinical Impressions(s) / UC Diagnoses   Final diagnoses:  Vaginitis and vulvovaginitis     Discharge Instructions      Your test results should come back in about 2-3 days. You will be notified and if any medication is indicated, it can be called into your preferred pharmacy.   In the meantime, you may try over the counter preparation H, Desitin or similar diaper rash cream, or vagisil cream to help with vaginal irritation. Avoid bubble baths or fragrant soaps/lotions in your groin/vaginal region to help prevent worsening irritation.     ED Prescriptions    None     Controlled Substance Prescriptions [place] Controlled Substance Registry  consulted? Not Applicable   Rolla Plate 02/27/19 [id number]

## 2019-02-27 NOTE — Discharge Instructions (Signed)
°  Your test results should come back in about 2-3 days. You will be notified and if any medication is indicated, it can be called into your preferred pharmacy.   In the meantime, you may try over the counter preparation H, Desitin or similar diaper rash cream, or vagisil cream to help with vaginal irritation. Avoid bubble baths or fragrant soaps/lotions in your groin/vaginal region to help prevent worsening irritation.

## 2019-02-27 NOTE — ED Triage Notes (Signed)
Pt felt that her pH was off so she douched Sunday night, has had vaginal itching since.

## 2019-02-28 LAB — CERVICOVAGINAL ANCILLARY ONLY
Bacterial vaginitis: POSITIVE — AB
Candida vaginitis: POSITIVE — AB
Chlamydia: NEGATIVE
Neisseria Gonorrhea: NEGATIVE
Trichomonas: POSITIVE — AB

## 2019-03-02 ENCOUNTER — Telehealth: Payer: Self-pay | Admitting: Family Medicine

## 2019-03-02 MED ORDER — FLUCONAZOLE 150 MG PO TABS: ORAL_TABLET | ORAL | 0 refills | Status: DC

## 2019-03-02 MED ORDER — METRONIDAZOLE 500 MG PO TABS: ORAL_TABLET | ORAL | 0 refills | Status: DC

## 2019-03-02 NOTE — Telephone Encounter (Signed)
Pt notified that rx is being sent in per Dr Assunta Found.

## 2019-03-02 NOTE — Telephone Encounter (Signed)
Cervicovaginal ancillary test positive for BV, candida, and trichomonas. Will begin Flagyl and Diflucan

## 2019-03-03 NOTE — Telephone Encounter (Signed)
Pt called and stated that her partner came in for script for Trich.  Spoke with Tilden Fossa, PAC and she stated that pt would have to be seen and tested before given a script.  Partner did not have time for visit today, said he would come back tomorrow.

## 2019-06-23 ENCOUNTER — Other Ambulatory Visit: Payer: Self-pay

## 2019-06-23 ENCOUNTER — Ambulatory Visit (INDEPENDENT_AMBULATORY_CARE_PROVIDER_SITE_OTHER): Payer: Managed Care, Other (non HMO) | Admitting: Family Medicine

## 2019-06-23 VITALS — BP 124/73 | HR 79 | Ht 67.0 in | Wt 240.0 lb

## 2019-06-23 DIAGNOSIS — E282 Polycystic ovarian syndrome: Secondary | ICD-10-CM | POA: Diagnosis not present

## 2019-06-23 DIAGNOSIS — Z0000 Encounter for general adult medical examination without abnormal findings: Secondary | ICD-10-CM

## 2019-06-23 DIAGNOSIS — A749 Chlamydial infection, unspecified: Secondary | ICD-10-CM | POA: Insufficient documentation

## 2019-06-23 DIAGNOSIS — E669 Obesity, unspecified: Secondary | ICD-10-CM | POA: Insufficient documentation

## 2019-06-23 DIAGNOSIS — Z6835 Body mass index (BMI) 35.0-35.9, adult: Secondary | ICD-10-CM | POA: Insufficient documentation

## 2019-06-23 DIAGNOSIS — R109 Unspecified abdominal pain: Secondary | ICD-10-CM | POA: Insufficient documentation

## 2019-06-23 DIAGNOSIS — N62 Hypertrophy of breast: Secondary | ICD-10-CM | POA: Diagnosis not present

## 2019-06-23 DIAGNOSIS — N926 Irregular menstruation, unspecified: Secondary | ICD-10-CM | POA: Insufficient documentation

## 2019-06-23 DIAGNOSIS — L680 Hirsutism: Secondary | ICD-10-CM | POA: Insufficient documentation

## 2019-06-23 NOTE — Patient Instructions (Signed)
Health Maintenance, Female Adopting a healthy lifestyle and getting preventive care are important in promoting health and wellness. Ask your health care provider about:  The right schedule for you to have regular tests and exams.  Things you can do on your own to prevent diseases and keep yourself healthy. What should I know about diet, weight, and exercise? Eat a healthy diet   Eat a diet that includes plenty of vegetables, fruits, low-fat dairy products, and lean protein.  Do not eat a lot of foods that are high in solid fats, added sugars, or sodium. Maintain a healthy weight Body mass index (BMI) is used to identify weight problems. It estimates body fat based on height and weight. Your health care provider can help determine your BMI and help you achieve or maintain a healthy weight. Get regular exercise Get regular exercise. This is one of the most important things you can do for your health. Most adults should:  Exercise for at least 150 minutes each week. The exercise should increase your heart rate and make you sweat (moderate-intensity exercise).  Do strengthening exercises at least twice a week. This is in addition to the moderate-intensity exercise.  Spend less time sitting. Even light physical activity can be beneficial. Watch cholesterol and blood lipids Have your blood tested for lipids and cholesterol at 27 years of age, then have this test every 5 years. Have your cholesterol levels checked more often if:  Your lipid or cholesterol levels are high.  You are older than 27 years of age.  You are at high risk for heart disease. What should I know about cancer screening? Depending on your health history and family history, you may need to have cancer screening at various ages. This may include screening for:  Breast cancer.  Cervical cancer.  Colorectal cancer.  Skin cancer.  Lung cancer. What should I know about heart disease, diabetes, and high blood  pressure? Blood pressure and heart disease  High blood pressure causes heart disease and increases the risk of stroke. This is more likely to develop in people who have high blood pressure readings, are of African descent, or are overweight.  Have your blood pressure checked: ? Every 3-5 years if you are 18-39 years of age. ? Every year if you are 40 years old or older. Diabetes Have regular diabetes screenings. This checks your fasting blood sugar level. Have the screening done:  Once every three years after age 40 if you are at a normal weight and have a low risk for diabetes.  More often and at a younger age if you are overweight or have a high risk for diabetes. What should I know about preventing infection? Hepatitis B If you have a higher risk for hepatitis B, you should be screened for this virus. Talk with your health care provider to find out if you are at risk for hepatitis B infection. Hepatitis C Testing is recommended for:  Everyone born from 1945 through 1965.  Anyone with known risk factors for hepatitis C. Sexually transmitted infections (STIs)  Get screened for STIs, including gonorrhea and chlamydia, if: ? You are sexually active and are younger than 27 years of age. ? You are older than 27 years of age and your health care provider tells you that you are at risk for this type of infection. ? Your sexual activity has changed since you were last screened, and you are at increased risk for chlamydia or gonorrhea. Ask your health care provider if   you are at risk.  Ask your health care provider about whether you are at high risk for HIV. Your health care provider may recommend a prescription medicine to help prevent HIV infection. If you choose to take medicine to prevent HIV, you should first get tested for HIV. You should then be tested every 3 months for as long as you are taking the medicine. Pregnancy  If you are about to stop having your period (premenopausal) and  you may become pregnant, seek counseling before you get pregnant.  Take 400 to 800 micrograms (mcg) of folic acid every day if you become pregnant.  Ask for birth control (contraception) if you want to prevent pregnancy. Osteoporosis and menopause Osteoporosis is a disease in which the bones lose minerals and strength with aging. This can result in bone fractures. If you are 65 years old or older, or if you are at risk for osteoporosis and fractures, ask your health care provider if you should:  Be screened for bone loss.  Take a calcium or vitamin D supplement to lower your risk of fractures.  Be given hormone replacement therapy (HRT) to treat symptoms of menopause. Follow these instructions at home: Lifestyle  Do not use any products that contain nicotine or tobacco, such as cigarettes, e-cigarettes, and chewing tobacco. If you need help quitting, ask your health care provider.  Do not use street drugs.  Do not share needles.  Ask your health care provider for help if you need support or information about quitting drugs. Alcohol use  Do not drink alcohol if: ? Your health care provider tells you not to drink. ? You are pregnant, may be pregnant, or are planning to become pregnant.  If you drink alcohol: ? Limit how much you use to 0-1 drink a day. ? Limit intake if you are breastfeeding.  Be aware of how much alcohol is in your drink. In the [place] one drink equals one 12 oz bottle of beer (355 mL), one 5 oz glass of wine (148 mL), or one 1 oz glass of hard liquor (44 mL). General instructions  Schedule regular health, dental, and eye exams.  Stay current with your vaccines.  Tell your health care provider if: ? You often feel depressed. ? You have ever been abused or do not feel safe at home. Summary  Adopting a healthy lifestyle and getting preventive care are important in promoting health and wellness.  Follow your health care provider's instructions about healthy  diet, exercising, and getting tested or screened for diseases.  Follow your health care provider's instructions on monitoring your cholesterol and blood pressure. This information is not intended to replace advice given to you by your health care provider. Make sure you discuss any questions you have with your health care provider. Document Released: 03/27/2011 Document Revised: 09/04/2018 Document Reviewed: 09/04/2018 Elsevier Patient Education  2020 Elsevier Inc.  

## 2019-06-23 NOTE — Progress Notes (Signed)
Subjective:     Alyssa Sparks is a 28 y.o. female and is here for a comprehensive physical exam. The patient reports problems - large breasts. .  She would like to consider plastic surgery referral for consultation.  We did she referred her about 3 years ago but she was told that her BMI was too high.  She currently wears a 48 I.  She is at the point where she just feels like it starting to really impact her back and causing pain.  She is noticing that it is harder not to slump forward.  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  She feels like her straps really digging to her shoulders.  So when she is at home she will wear a more light thin strap but then if she is going to go out she really needs more support and has to wear a thicker heavier strap but then it really pulls on her shoulders.  She is also been under a lot of stress recently helping take care of her mother who is really struggling with compliance with her diabetes.  She is an only child and the only grandchild so she is also been helping her grandparents.  She is been going to school and working 2 jobs.   Social History   Socioeconomic History  . Marital status: Single    Spouse name: Not on file  . Number of children: Not on file  . Years of education: Not on file  . Highest education level: Not on file  Occupational History  . Occupation: CNA/MED-TECH  Social Needs  . Financial resource strain: Not on file  . Food insecurity    Worry: Not on file    Inability: Not on file  . Transportation needs    Medical: Not on file    Non-medical: Not on file  Tobacco Use  . Smoking status: Never Smoker  . Smokeless tobacco: Never Used  Substance and Sexual Activity  . Alcohol use: No  . Drug use: No  . Sexual activity: Yes    Birth control/protection: None    Comment: WAS ON B/C STOP ON HER OWN  Lifestyle  . Physical activity    Days per  week: Not on file    Minutes per session: Not on file  . Stress: Not on file  Relationships  . Social Musician on phone: Not on file    Gets together: Not on file    Attends religious service: Not on file    Active member of club or organization: Not on file    Attends meetings of clubs or organizations: Not on file    Relationship status: Not on file  . Intimate partner violence    Fear of current or ex partner: Not on file    Emotionally abused: Not on file    Physically abused: Not on file    Forced sexual activity: Not on file  Other Topics Concern  . Not on file  Social History Narrative  . Not on file   Health Maintenance  Topic Date Due  . HIV Screening  04/30/2007  . INFLUENZA VACCINE  12/24/2019 (Originally 04/26/2019)  . PAP-Cervical Cytology Screening  07/31/2021  . PAP SMEAR-Modifier  07/31/2021  . TETANUS/TDAP  03/25/2024    The following portions of the patient's history were reviewed and updated as appropriate: allergies,  current medications, past family history, past medical history, past social history, past surgical history and problem list.  Review of Systems A comprehensive review of systems was negative.   Objective:    BP 124/73   Pulse 79   Ht 5\' 7"  (1.702 m)   Wt 240 lb (108.9 kg)   LMP 06/10/2019 (Exact Date)   SpO2 100%   BMI 37.59 kg/m  General appearance: alert, cooperative and appears stated age Head: Normocephalic, without obvious abnormality, atraumatic Eyes: conj clear, EOMI, PEERLA Ears: normal TM's and external ear canals both ears Nose: Nares normal. Septum midline. Mucosa normal. No drainage or sinus tenderness. Neck: no adenopathy, no carotid bruit, no JVD, supple, symmetrical, trachea midline and thyroid not enlarged, symmetric, no tenderness/mass/nodules Back: symmetric, no curvature. ROM normal. No CVA tenderness. Lungs: clear to auscultation bilaterally Heart: regular rate and rhythm, S1, S2 normal, no murmur,  click, rub or gallop Abdomen: soft, non-tender; bowel sounds normal; no masses,  no organomegaly Extremities: extremities normal, atraumatic, no cyanosis or edema Pulses: 2+ and symmetric Skin: Skin color, texture, turgor normal. No rashes or lesions Lymph nodes: Cervical, supraclavicular, and axillary nodes normal. Neurologic: Alert and oriented X 3, normal strength and tone. Normal symmetric reflexes. Normal coordination and gait    Assessment:    Healthy female exam.      Plan:     See After Visit Summary for Counseling Recommendations   Keep up a regular exercise program and make sure you are eating a healthy diet Try to eat 4 servings of dairy a day, or if you are lactose intolerant take a calcium with vitamin D daily.  Your vaccines are up to date.    Macromastia - will refer to plastic surgery for consultations.   Acute stress -thus options including therapy/counseling.  She plans on reaching out to her pastor for support. PHQ-9 score of 17 and GAD 7 score of 18 today.  Discussed medication as a possibility as well. She will reach out to her pastor.

## 2019-06-24 LAB — HEMOGLOBIN A1C
Hgb A1c MFr Bld: 5.1 % of total Hgb (ref <5.7)
Mean Plasma Glucose: 100 (calc)
eAG (mmol/L): 5.5 (calc)

## 2019-06-26 LAB — COMPLETE METABOLIC PANEL WITH GFR
AG Ratio: 1.6 (calc) (ref 1.0–2.5)
ALT: 13 U/L (ref 6–29)
AST: 18 U/L (ref 10–30)
Albumin: 4.3 g/dL (ref 3.6–5.1)
Alkaline phosphatase (APISO): 65 U/L (ref 31–125)
BUN: 9 mg/dL (ref 7–25)
CO2: 26 mmol/L (ref 20–32)
Calcium: 9.1 mg/dL (ref 8.6–10.2)
Chloride: 106 mmol/L (ref 98–110)
Creat: 0.73 mg/dL (ref 0.50–1.10)
GFR, Est African American: 131 mL/min/{1.73_m2} (ref >=60)
GFR, Est Non African American: 113 mL/min/{1.73_m2} (ref >=60)
Globulin: 2.7 g/dL (calc) (ref 1.9–3.7)
Glucose, Bld: 97 mg/dL (ref 65–99)
Potassium: 3.8 mmol/L (ref 3.5–5.3)
Sodium: 142 mmol/L (ref 135–146)
Total Bilirubin: 0.3 mg/dL (ref 0.2–1.2)
Total Protein: 7 g/dL (ref 6.1–8.1)

## 2019-06-26 LAB — LIPID PANEL
Cholesterol: 130 mg/dL (ref <200)
HDL: 43 mg/dL — ABNORMAL LOW (ref >=50)
LDL Cholesterol (Calc): 69 mg/dL (calc)
Non-HDL Cholesterol (Calc): 87 mg/dL (calc) (ref <130)
Total CHOL/HDL Ratio: 3 (calc) (ref <5.0)
Triglycerides: 92 mg/dL (ref <150)

## 2019-06-26 LAB — CBC
HCT: 36.1 % (ref 35.0–45.0)
Hemoglobin: 11.3 g/dL — ABNORMAL LOW (ref 11.7–15.5)
MCH: 29.8 pg (ref 27.0–33.0)
MCHC: 31.3 g/dL — ABNORMAL LOW (ref 32.0–36.0)
MCV: 95.3 fL (ref 80.0–100.0)
MPV: 10 fL (ref 7.5–12.5)
Platelets: 371 10*3/uL (ref 140–400)
RBC: 3.79 10*6/uL — ABNORMAL LOW (ref 3.80–5.10)
RDW: 11.4 % (ref 11.0–15.0)
WBC: 7.6 10*3/uL (ref 3.8–10.8)

## 2019-06-26 LAB — FERRITIN: Ferritin: 67 ng/mL (ref 16–154)

## 2019-06-26 LAB — IRON: Iron: 49 ug/dL (ref 40–190)

## 2019-10-31 ENCOUNTER — Encounter: Payer: Self-pay | Admitting: Family Medicine

## 2019-10-31 ENCOUNTER — Other Ambulatory Visit: Payer: Self-pay

## 2019-10-31 ENCOUNTER — Ambulatory Visit (INDEPENDENT_AMBULATORY_CARE_PROVIDER_SITE_OTHER): Payer: Managed Care, Other (non HMO) | Admitting: Family Medicine

## 2019-10-31 VITALS — BP 132/79 | HR 85 | Ht 67.0 in | Wt 236.0 lb

## 2019-10-31 DIAGNOSIS — F430 Acute stress reaction: Secondary | ICD-10-CM

## 2019-10-31 DIAGNOSIS — N912 Amenorrhea, unspecified: Secondary | ICD-10-CM

## 2019-10-31 DIAGNOSIS — R5383 Other fatigue: Secondary | ICD-10-CM | POA: Diagnosis not present

## 2019-10-31 MED ORDER — ESCITALOPRAM OXALATE 10 MG PO TABS: ORAL_TABLET | ORAL | 1 refills | Status: DC

## 2019-10-31 NOTE — Progress Notes (Signed)
Established Patient Office Visit  Subjective:  Patient ID: Alyssa Sparks, female    DOB: [birth year]  Age: 29 y.o. MRN: [id number]  CC:  Chief Complaint  Patient presents with  . Fatigue  . mood    HPI Alyssa Sparks presents for low energy and excessive sleep.    She reports that for the past 3-4 weeks she can sleep all day after getting home from work and wake up at 530 PM to get ready for work. She said that she still feels tired after sleeping. She works 3rd shift at [employer] on the [employer]. 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 On average she has been taking care of about 15 pt on average and has had up to 30 at one time.  She is feeling very stressed and overwhelmed.  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  She said that she had to leave early on Tuesday. She would like to have something to help her to better cope with this.   She also wants to let me know that she has not had a period since October.  In October she had a period for almost 2 weeks that was really quite heavy so she went to her OB/GYN.  They gave her Provera for several days to stop the bleeding immediately and then gave her a Depo injection.  But she has not restarted her cycle since then.  She does have history of PCOS.  They talked about maybe starting birth control but she decided not to start it.  Past Medical History:  Diagnosis Date  . Broken arm    RIGHT  . Polycystic ovarian disease     History reviewed. No pertinent surgical history.  Family History  Problem Relation Age of Onset  . Breast cancer Other   . Hypertension Mother   . Diabetes Mother   . Heart attack Father   . Diabetes Maternal Grandmother   . Hypertension Maternal Grandmother   . Deep vein thrombosis Maternal Grandmother   . Diabetes Maternal  Grandfather   . Hypertension Maternal Grandfather   . Prostate cancer Maternal Grandfather   . Cancer Maternal Grandfather        PROSTATE    Social History   Socioeconomic History  . Marital status: Single    Spouse name: Not on file  . Number of children: Not on file  . Years of education: Not on file  . Highest education level: Not on file  Occupational History  . Occupation: CNA/MED-TECH  Tobacco Use  . Smoking status: Never Smoker  . Smokeless tobacco: Never Used  Substance and Sexual Activity  . Alcohol use: No  . Drug use: No  . Sexual activity: Yes    Birth control/protection: None    Comment: WAS ON B/C STOP ON HER OWN  Other Topics Concern  . Not on file  Social History Narrative  . Not on file   Social Determinants of Health   Financial Resource Strain:   . Difficulty of Paying Living Expenses: Not on file  Food Insecurity:   . Worried About Programme researcher, broadcasting/film/video in the Last Year: Not on file  . Ran Out of Food in the Last Year: Not on file  Transportation Needs:   .  Lack of Transportation (Medical): Not on file  . Lack of Transportation (Non-Medical): Not on file  Physical Activity:   . Days of Exercise per Week: Not on file  . Minutes of Exercise per Session: Not on file  Stress:   . Feeling of Stress : Not on file  Social Connections:   . Frequency of Communication with Friends and Family: Not on file  . Frequency of Social Gatherings with Friends and Family: Not on file  . Attends Religious Services: Not on file  . Active Member of Clubs or Organizations: Not on file  . Attends Banker Meetings: Not on file  . Marital Status: Not on file  Intimate Partner Violence:   . Fear of Current or Ex-Partner: Not on file  . Emotionally Abused: Not on file  . Physically Abused: Not on file  . Sexually Abused: Not on file    Outpatient Medications Prior to Visit  Medication Sig Dispense Refill  . Multiple Vitamins-Calcium (ONE-A-DAY WOMENS  FORMULA PO) Take 1 tablet by mouth daily.    . drospirenone-ethinyl estradiol (YASMIN) 3-0.03 MG tablet Take 1 tablet by mouth daily. as directed     No facility-administered medications prior to visit.    No Known Allergies  ROS Review of Systems    Objective:    Physical Exam  Constitutional: She is oriented to person, place, and time. She appears well-developed and well-nourished.  HENT:  Head: Normocephalic and atraumatic.  Eyes: Conjunctivae and EOM are normal.  Cardiovascular: Normal rate, regular rhythm and normal heart sounds.  Pulmonary/Chest: Effort normal and breath sounds normal.  Neurological: She is alert and oriented to person, place, and time.  Skin: Skin is warm and dry. No pallor.  Psychiatric: She has a normal mood and affect. Her behavior is normal.  Vitals reviewed.   BP 132/79   Pulse 85   Ht 5\' 7"  (1.702 m)   Wt 236 lb (107 kg)   LMP 08/01/2019 (Exact Date)   SpO2 100%   BMI 36.96 kg/m  Wt Readings from Last 3 Encounters:  10/31/19 236 lb (107 kg)  06/23/19 240 lb (108.9 kg)  02/27/19 219 lb (99.3 kg)     There are no preventive care reminders to display for this patient.  There are no preventive care reminders to display for this patient.  Lab Results  Component Value Date   TSH 1.415 06/04/2012   Lab Results  Component Value Date   WBC 7.6 06/23/2019   HGB 11.3 (L) 06/23/2019   HCT 36.1 06/23/2019   MCV 95.3 06/23/2019   PLT 371 06/23/2019   Lab Results  Component Value Date   NA 142 06/23/2019   K 3.8 06/23/2019   CO2 26 06/23/2019   GLUCOSE 97 06/23/2019   BUN 9 06/23/2019   CREATININE 0.73 06/23/2019   BILITOT 0.3 06/23/2019   AST 18 06/23/2019   ALT 13 06/23/2019   PROT 7.0 06/23/2019   CALCIUM 9.1 06/23/2019   Lab Results  Component Value Date   CHOL 130 06/23/2019   Lab Results  Component Value Date   HDL 43 (L) 06/23/2019   Lab Results  Component Value Date   LDLCALC 69 06/23/2019   Lab Results   Component Value Date   TRIG 92 06/23/2019   Lab Results  Component Value Date   CHOLHDL 3.0 06/23/2019   Lab Results  Component Value Date   HGBA1C 5.1 06/23/2019      Assessment & Plan:  Problem List Items Addressed This Visit      Other   Acute stress reaction    Discussed options. Encouraged her to reach out to [facility] through her work for free counseling.  We can also refer to our therapist as well. PHQ-9 score of 23 and GAD-7 score of 20.  Dsicussed medication options. Will start lexapro. F/U in 3 weeks. Dooms for vidrtual.  Also discussed self-care.       Relevant Medications   escitalopram (LEXAPRO) 10 MG tablet    Other Visit Diagnoses    Fatigue, unspecified type    -  Primary   Relevant Orders   CBC   COMPLETE METABOLIC PANEL WITH GFR   TSH   Hemoglobin A1c   Fe+TIBC+Fer   Estradiol   Follicle stimulating hormone   Luteinizing hormone   Progesterone   Amenorrhea       Relevant Orders   CBC   COMPLETE METABOLIC PANEL WITH GFR   TSH   Hemoglobin A1c   Fe+TIBC+Fer   Estradiol   Follicle stimulating hormone   Luteinizing hormone   Progesterone      Amenorrhea -  Most likely from the Depo Provera injection which often causes amenorrhea. Though also has a hx of PCOS. Will check hormone levels.    Fatigue-unclear etiology we will do labs to evaluate for possible anemia, thyroid disorder, electrolyte disturbance etc.  It sounds like she was having some heavy periods prior so certainly the risk for iron deficiency is pretty high.  Depression/anxiety-this could also be contributing to some of the increased fatigue.  We discussed treatment options.  I did encourage her to reach out to [facility] through work.  In addition we will go ahead and start Lexapro and plan to follow-up in 3 weeks.  Meds ordered this encounter  Medications  . escitalopram (LEXAPRO) 10 MG tablet    Sig: 1/2 tab po QD x 14 days, then ok to increase to whole tab daily    Dispense:  30 tablet     Refill:  1    Follow-up: Return in about 3 weeks (around 11/21/2019) for New start Mood medication .    Beatrice Lecher, MD

## 2019-10-31 NOTE — Progress Notes (Signed)
She reports that for the past 3-4 weeks she can sleep all day after getting home from work and wake up at 530 PM to get ready for work. She said that she still feels tired after sleeping. She works 3rd shift at [employer].  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 She said that she had to leave early on Tuesday. She would like to have something to help her to better cope with this.

## 2019-11-01 DIAGNOSIS — F430 Acute stress reaction: Secondary | ICD-10-CM | POA: Insufficient documentation

## 2019-11-01 HISTORY — DX: Acute stress reaction: F43.0

## 2019-11-01 NOTE — Assessment & Plan Note (Signed)
Discussed options. Encouraged her to reach out to [facility] through her work for free counseling.  We can also refer to our therapist as well. PHQ-9 score of 23 and GAD-7 score of 20.  Dsicussed medication options. Will start lexapro. F/U in 3 weeks. Ok for vidrtual.  Also discussed self-care.

## 2019-11-04 LAB — IRON,TIBC AND FERRITIN PANEL
%SAT: 18 % (calc) (ref 16–45)
Ferritin: 94 ng/mL (ref 16–154)
Iron: 53 ug/dL (ref 40–190)
TIBC: 288 mcg/dL (calc) (ref 250–450)

## 2019-11-04 LAB — HEMOGLOBIN A1C
Hgb A1c MFr Bld: 5 % of total Hgb (ref <5.7)
Mean Plasma Glucose: 97 (calc)
eAG (mmol/L): 5.4 (calc)

## 2019-11-04 LAB — LUTEINIZING HORMONE: LH: 7 m[IU]/mL

## 2019-11-04 LAB — CBC
HCT: 38.5 % (ref 35.0–45.0)
Hemoglobin: 12.4 g/dL (ref 11.7–15.5)
MCH: 30.6 pg (ref 27.0–33.0)
MCHC: 32.2 g/dL (ref 32.0–36.0)
MCV: 95.1 fL (ref 80.0–100.0)
MPV: 10 fL (ref 7.5–12.5)
Platelets: 335 10*3/uL (ref 140–400)
RBC: 4.05 10*6/uL (ref 3.80–5.10)
RDW: 11.4 % (ref 11.0–15.0)
WBC: 8.7 10*3/uL (ref 3.8–10.8)

## 2019-11-04 LAB — COMPLETE METABOLIC PANEL WITH GFR
AG Ratio: 1.7 (calc) (ref 1.0–2.5)
ALT: 9 U/L (ref 6–29)
AST: 14 U/L (ref 10–30)
Albumin: 4.3 g/dL (ref 3.6–5.1)
Alkaline phosphatase (APISO): 68 U/L (ref 31–125)
BUN: 12 mg/dL (ref 7–25)
CO2: 25 mmol/L (ref 20–32)
Calcium: 9.4 mg/dL (ref 8.6–10.2)
Chloride: 110 mmol/L (ref 98–110)
Creat: 0.8 mg/dL (ref 0.50–1.10)
GFR, Est African American: 117 mL/min/{1.73_m2} (ref >=60)
GFR, Est Non African American: 101 mL/min/{1.73_m2} (ref >=60)
Globulin: 2.6 g/dL (calc) (ref 1.9–3.7)
Glucose, Bld: 113 mg/dL — ABNORMAL HIGH (ref 65–99)
Potassium: 3.9 mmol/L (ref 3.5–5.3)
Sodium: 143 mmol/L (ref 135–146)
Total Bilirubin: 0.4 mg/dL (ref 0.2–1.2)
Total Protein: 6.9 g/dL (ref 6.1–8.1)

## 2019-11-04 LAB — ESTRADIOL: Estradiol: 64 pg/mL

## 2019-11-04 LAB — FOLLICLE STIMULATING HORMONE: FSH: 10.7 m[IU]/mL

## 2019-11-04 LAB — TSH: TSH: 1.19 mIU/L

## 2019-11-04 LAB — PROGESTERONE: Progesterone: 0.5 ng/mL

## 2019-11-20 ENCOUNTER — Ambulatory Visit (INDEPENDENT_AMBULATORY_CARE_PROVIDER_SITE_OTHER): Payer: Managed Care, Other (non HMO) | Admitting: Family Medicine

## 2019-11-20 ENCOUNTER — Other Ambulatory Visit: Payer: Self-pay

## 2019-11-20 VITALS — BP 130/83 | HR 83 | Ht 67.0 in | Wt 238.0 lb

## 2019-11-20 DIAGNOSIS — L709 Acne, unspecified: Secondary | ICD-10-CM | POA: Insufficient documentation

## 2019-11-20 DIAGNOSIS — F430 Acute stress reaction: Secondary | ICD-10-CM

## 2019-11-20 NOTE — Progress Notes (Signed)
Established Patient Office Visit  Subjective:  Patient ID: Alyssa Sparks, female    DOB: [birth year]  Age: 29 y.o. MRN: [id number]  CC:  Chief Complaint  Patient presents with  . mood    HPI [name] presents for F/U new start Lexapro for mood.  She is doing well on the medication so far.  She says about the first 3 days she had some headaches but those resolved pretty quickly.  Since then she is really noticed improvement in her overall mood.  She feels much less stressed.  She is even been sitting in her car before going into work and really just thinking about the day and trying to encourage herself to just do as much as she can and not feel stressed and overwhelmed when people come out her to ask her to do things.  She said that she did ask them to put her on a different unit at least for a short period of time and they did for about a week and then they put her back onto the Covid unit which some days can be really stressful.  She feels like she has more energy and has noticed that on her days off she is actually more motivated to get up and do things and do things that she really enjoys and has fun.  She did want to ask about her acne.  She normally has a little bit on her facial cheeks and forehead but says since wearing the mask etc. at work it is gotten pretty bad and wanted to know what else she could do for and if there was anything over-the-counter that would be safe.  Past Medical History:  Diagnosis Date  . Broken arm    RIGHT  . Polycystic ovarian disease     No past surgical history on file.  Family History  Problem Relation Age of Onset  . Breast cancer Other   . Hypertension Mother   . Diabetes Mother   . Heart attack Father   . Diabetes Maternal Grandmother   . Hypertension Maternal Grandmother   . Deep vein thrombosis Maternal Grandmother   . Diabetes Maternal Grandfather   . Hypertension Maternal Grandfather   . Prostate cancer Maternal Grandfather   .  Cancer Maternal Grandfather        PROSTATE    Social History   Socioeconomic History  . Marital status: Single    Spouse name: Not on file  . Number of children: Not on file  . Years of education: Not on file  . Highest education level: Not on file  Occupational History  . Occupation: CNA/MED-TECH  Tobacco Use  . Smoking status: Never Smoker  . Smokeless tobacco: Never Used  Substance and Sexual Activity  . Alcohol use: No  . Drug use: No  . Sexual activity: Yes    Birth control/protection: None    Comment: WAS ON B/C STOP ON HER OWN  Other Topics Concern  . Not on file  Social History Narrative  . Not on file   Social Determinants of Health   Financial Resource Strain:   . Difficulty of Paying Living Expenses: Not on file  Food Insecurity:   . Worried About Charity fundraiser in the Last Year: Not on file  . Ran Out of Food in the Last Year: Not on file  Transportation Needs:   . Lack of Transportation (Medical): Not on file  . Lack of Transportation (Non-Medical): Not on file  Physical  Activity:   . Days of Exercise per Week: Not on file  . Minutes of Exercise per Session: Not on file  Stress:   . Feeling of Stress : Not on file  Social Connections:   . Frequency of Communication with Friends and Family: Not on file  . Frequency of Social Gatherings with Friends and Family: Not on file  . Attends Religious Services: Not on file  . Active Member of Clubs or Organizations: Not on file  . Attends Banker Meetings: Not on file  . Marital Status: Not on file  Intimate Partner Violence:   . Fear of Current or Ex-Partner: Not on file  . Emotionally Abused: Not on file  . Physically Abused: Not on file  . Sexually Abused: Not on file    Outpatient Medications Prior to Visit  Medication Sig Dispense Refill  . AMBULATORY NON FORMULARY MEDICATION Take 4 tablets by mouth 2 (two) times daily. Medication Name: Myo-D+ inositol. 2 tablets in the morning 2  tablets in evening.    . escitalopram (LEXAPRO) 10 MG tablet 1/2 tab po QD x 14 days, then ok to increase to whole tab daily 30 tablet 1  . Multiple Vitamins-Calcium (ONE-A-DAY WOMENS FORMULA PO) Take 1 tablet by mouth daily.     No facility-administered medications prior to visit.    No Known Allergies  ROS Review of Systems    Objective:    Physical Exam  Constitutional: She is oriented to person, place, and time. She appears well-developed and well-nourished.  HENT:  Head: Normocephalic and atraumatic.  Cardiovascular: Normal rate, regular rhythm and normal heart sounds.  Pulmonary/Chest: Effort normal and breath sounds normal.  Neurological: She is alert and oriented to person, place, and time.  Skin: Skin is warm and dry.  Psychiatric: She has a normal mood and affect. Her behavior is normal.    BP 130/83   Pulse 83   Ht 5\' 7"  (1.702 m)   Wt 238 lb (108 kg)   SpO2 100%   BMI 37.28 kg/m  Wt Readings from Last 3 Encounters:  11/20/19 238 lb (108 kg)  10/31/19 236 lb (107 kg)  06/23/19 240 lb (108.9 kg)     There are no preventive care reminders to display for this patient.  There are no preventive care reminders to display for this patient.  Lab Results  Component Value Date   TSH 1.19 11/03/2019   Lab Results  Component Value Date   WBC 8.7 11/03/2019   HGB 12.4 11/03/2019   HCT 38.5 11/03/2019   MCV 95.1 11/03/2019   PLT 335 11/03/2019   Lab Results  Component Value Date   NA 143 11/03/2019   K 3.9 11/03/2019   CO2 25 11/03/2019   GLUCOSE 113 (H) 11/03/2019   BUN 12 11/03/2019   CREATININE 0.80 11/03/2019   BILITOT 0.4 11/03/2019   AST 14 11/03/2019   ALT 9 11/03/2019   PROT 6.9 11/03/2019   CALCIUM 9.4 11/03/2019   Lab Results  Component Value Date   CHOL 130 06/23/2019   Lab Results  Component Value Date   HDL 43 (L) 06/23/2019   Lab Results  Component Value Date   LDLCALC 69 06/23/2019   Lab Results  Component Value Date    TRIG 92 06/23/2019   Lab Results  Component Value Date   CHOLHDL 3.0 06/23/2019   Lab Results  Component Value Date   HGBA1C 5.0 11/03/2019      Assessment &  Plan:   Problem List Items Addressed This Visit      Musculoskeletal and Integument   Acne    Gust treatment with topical benzyl peroxide and a topical retinoid such as over-the-counter Differin.  Did warn that these can be irritating and to moisturize the skin well while using these.  We will follow up in 2 to 3 months and can adjust regimen at that time if needed.  She can also decrease use of the Differin and benzyl peroxide if she is noticing any inflammation.        Other   Acute stress reaction - Primary    Doing well on Lexapro.  PHQ-9 score is down 2 which is down from previous of 23.  GAD-7 score of 6 which is down from previous of 20.  Significant improvement.  Discussed we will continue with current regimen and it can take a few more weeks to reach full efficacy.  Plan to follow-up in about 2 to 3 months to make sure that she is doing well.  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         No orders of the defined types were placed in this encounter.   Follow-up: Return in about 2 months (around 01/24/2020).    Nani Gasser, MD

## 2019-11-20 NOTE — Assessment & Plan Note (Signed)
Gust treatment with topical benzyl peroxide and a topical retinoid such as over-the-counter Differin.  Did warn that these can be irritating and to moisturize the skin well while using these.  We will follow up in 2 to 3 months and can adjust regimen at that time if needed.  She can also decrease use of the Differin and benzyl peroxide if she is noticing any inflammation.

## 2019-11-20 NOTE — Assessment & Plan Note (Signed)
Doing well on Lexapro.  PHQ-9 score is down 2 which is down from previous of 23.  GAD-7 score of 6 which is down from previous of 20.  Significant improvement.  Discussed we will continue with current regimen and it can take a few more weeks to reach full efficacy.  Plan to follow-up in about 2 to 3 months to make sure that she is doing well.  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

## 2019-11-20 NOTE — Patient Instructions (Addendum)
Differin OTC for acne in the morning Use benzoyl peroxide at night.      Acne  Acne is a skin problem that causes pimples and other skin changes. The skin has many tiny openings called pores. Each pore contains an oil gland. Oil glands make an oily substance that is called sebum. Acne occurs when the pores in the skin get blocked. The pores may become infected with bacteria, or they may become red, sore, and swollen. Acne is a common skin problem, especially for teenagers. It often occurs on the face, neck, chest, upper arms, and back. Acne usually goes away over time. What are the causes? Acne is caused when oil glands get blocked with sebum, dead skin cells, and dirt. The bacteria that are normally found in the oil glands then multiply and cause inflammation. Acne is commonly triggered by changes in your hormones. These hormonal changes can cause the oil glands to get bigger and to make more sebum. Factors that can make acne worse include:  Hormone changes during: ? Adolescence. ? Women's menstrual cycles. ? Pregnancy.  Oil-based cosmetics and hair products.  Stress.  Hormone problems that are caused by certain diseases.  Certain medicines.  Pressure from headbands, backpacks, or shoulder pads.  Exposure to certain oils and chemicals.  Eating a diet high in carbohydrates that quickly turn to sugar. These include dairy products, desserts, and chocolates. What increases the risk? This condition is more likely to develop in:  Teenagers.  People who have a family history of acne. What are the signs or symptoms? Symptoms include:  Small, red bumps (pimples or papules).  Whiteheads.  Blackheads.  Small, pus-filled pimples (pustules).  Big, red pimples or pustules that feel tender. More severe acne can cause:  An abscess. This is an infected area that contains a collection of pus.  Cysts. These are hard, painful, fluid-filled sacs.  Scars. These can happen after  large pimples heal. How is this diagnosed? This condition is diagnosed with a medical history and physical exam. Blood tests may also be done. How is this treated? Treatment for this condition can vary depending on the severity of your acne. Treatment may include:  Creams and lotions that prevent oil glands from clogging.  Creams and lotions that treat or prevent infections and inflammation.  Antibiotic medicines that are applied to the skin or taken as a pill.  Pills that decrease sebum production.  Birth control pills.  Light or laser treatments.  Injections of medicine into the affected areas.  Chemicals that cause peeling of the skin.  Surgery. Your health care provider will also recommend the best way to take care of your skin. Good skin care is the most important part of treatment. Follow these instructions at home: Skin care Take care of your skin as told by your health care provider. You may be told to do these things:  Wash your skin gently at least two times each day, as well as: ? After you exercise. ? Before you go to bed.  Use mild soap.  Apply a water-based skin moisturizer after you wash your skin.  Use a sunscreen or sunblock with SPF 30 or greater. This is especially important if you are using acne medicines.  Choose cosmetics that will not block your oil glands (are noncomedogenic). Medicines  Take over-the-counter and prescription medicines only as told by your health care provider.  If you were prescribed an antibiotic medicine, apply it or take it as told by your health care  provider. Do not stop using the antibiotic even if your condition improves. General instructions  Keep your hair clean and off your face. If you have oily hair, shampoo your hair regularly or daily.  Avoid wearing tight headbands or hats.  Avoid picking or squeezing your pimples. That can make your acne worse and cause scarring.  Shave gently and only when  necessary.  Keep a food journal to figure out if any foods are linked to your acne. Avoid dairy products, desserts, and chocolates.  Take steps to manage and reduce stress.  Keep all follow-up visits as told by your health care provider. This is important. Contact a health care provider if:  Your acne is not better after eight weeks.  Your acne gets worse.  You have a large area of skin that is red or tender.  You think that you are having side effects from any acne medicine. Summary  Acne is a skin problem that causes pimples and other skin changes. Acne is a common skin problem, especially for teenagers. Acne usually goes away over time.  Acne is commonly triggered by changes in your hormones. There are many other causes, such as stress, diet, and certain medicines.  Follow your health care provider's instructions for how to take care of your skin. Good skin care is the most important part of treatment.  Take over-the-counter and prescription medicines only as told by your health care provider.  Contact your health care provider if you think that you are having side effects from any acne medicine. This information is not intended to replace advice given to you by your health care provider. Make sure you discuss any questions you have with your health care provider. Document Revised: 01/22/2018 Document Reviewed: 01/22/2018 Elsevier Patient Education  [employer].

## 2019-11-20 NOTE — Progress Notes (Signed)
Doing well on current regimen .  

## 2020-01-04 ENCOUNTER — Other Ambulatory Visit: Payer: Self-pay | Admitting: Family Medicine

## 2020-01-04 DIAGNOSIS — F430 Acute stress reaction: Secondary | ICD-10-CM

## 2020-01-23 ENCOUNTER — Ambulatory Visit (INDEPENDENT_AMBULATORY_CARE_PROVIDER_SITE_OTHER): Payer: Managed Care, Other (non HMO) | Admitting: Family Medicine

## 2020-01-23 ENCOUNTER — Encounter: Payer: Self-pay | Admitting: Family Medicine

## 2020-01-23 ENCOUNTER — Other Ambulatory Visit: Payer: Self-pay

## 2020-01-23 VITALS — BP 133/74 | HR 78 | Ht 67.0 in | Wt 236.0 lb

## 2020-01-23 DIAGNOSIS — E282 Polycystic ovarian syndrome: Secondary | ICD-10-CM

## 2020-01-23 DIAGNOSIS — N926 Irregular menstruation, unspecified: Secondary | ICD-10-CM

## 2020-01-23 DIAGNOSIS — F430 Acute stress reaction: Secondary | ICD-10-CM

## 2020-01-23 MED ORDER — METFORMIN HCL 500 MG PO TABS: 500.0000 mg | ORAL_TABLET | Freq: Three times a day (TID) | ORAL | 1 refills | Status: DC

## 2020-01-23 MED ORDER — ESCITALOPRAM OXALATE 10 MG PO TABS: 10.0000 mg | ORAL_TABLET | Freq: Every day | ORAL | 1 refills | Status: DC

## 2020-01-23 NOTE — Progress Notes (Signed)
Established Patient Office Visit  Subjective:  Patient ID: Alyssa Sparks, female    DOB: 07/13/1991  Age: 29 y.o. MRN: [id number]  CC:  Chief Complaint  Patient presents with  . Follow-up    HPI Alyssa Sparks presents for 2 mo f/u.  Follow-up for acute stress reaction.  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  This is our 63-month follow-up from previous visit where she was actually doing well with the new start of Lexapro.  Has any side effects on the medication.  She is doing really well overall.  She feels like work has been better and she is still planning on pursing a nursing degree. Feels like sleep is OK.    Did want to discuss her PCOS and her.  She think she would like to go back on the Metformin to help regulate her menstrual cycles.  She says the last time she had a normal period was in October.  Since then she had about 3 total days in February where she had some spotting in 1 day in April and that is it.  She is really been trying to do better with her diet and staying active.  She cut out a lot of sweets etc.  Past Medical History:  Diagnosis Date  . Broken arm    RIGHT  . Polycystic ovarian disease     History reviewed. No pertinent surgical history.  Family History  Problem Relation Age of Onset  . Breast cancer Other   . Hypertension Mother   . Diabetes Mother   . Heart attack Father   . Diabetes Maternal Grandmother   . Hypertension Maternal Grandmother   . Deep vein thrombosis Maternal Grandmother   . Diabetes Maternal Grandfather   . Hypertension Maternal Grandfather   . Prostate cancer Maternal Grandfather   . Cancer Maternal Grandfather        PROSTATE    Social History   Socioeconomic History  . Marital status: Single    Spouse name: Not on file  . Number of children: Not on file  . Years of education: Not on  file  . Highest education level: Not on file  Occupational History  . Occupation: CNA/MED-TECH  Tobacco Use  . Smoking status: Never Smoker  . Smokeless tobacco: Never Used  Substance and Sexual Activity  . Alcohol use: No  . Drug use: No  . Sexual activity: Yes    Birth control/protection: None    Comment: WAS ON B/C STOP ON HER OWN  Other Topics Concern  . Not on file  Social History Narrative  . Not on file   Social Determinants of Health   Financial Resource Strain:   . Difficulty of Paying Living Expenses:   Food Insecurity:   . Worried About Programme researcher, broadcasting/film/video in the Last Year:   . Barista in the Last Year:   Transportation Needs:   . Freight forwarder (Medical):   Marland Kitchen Lack of Transportation (Non-Medical):   Physical Activity:   . Days of Exercise per Week:   . Minutes of Exercise per Session:   Stress:   . Feeling of Stress :   Social Connections:   . Frequency of Communication with Friends and Family:   . Frequency of Social Gatherings with Friends and Family:   . Attends Religious Services:   .  Active Member of Clubs or Organizations:   . Attends Archivist Meetings:   Marland Kitchen Marital Status:   Intimate Partner Violence:   . Fear of Current or Ex-Partner:   . Emotionally Abused:   Marland Kitchen Physically Abused:   . Sexually Abused:     Outpatient Medications Prior to Visit  Medication Sig Dispense Refill  . AMBULATORY NON FORMULARY MEDICATION Take 4 tablets by mouth 2 (two) times daily. Medication Name: Myo-D+ inositol. 2 tablets in the morning 2 tablets in evening.    . Multiple Vitamins-Calcium (ONE-A-DAY WOMENS FORMULA PO) Take 1 tablet by mouth daily.    Marland Kitchen escitalopram (LEXAPRO) 10 MG tablet Take 1 tablet (10 mg total) by mouth daily. 90 tablet 0   No facility-administered medications prior to visit.    No Known Allergies  ROS Review of Systems    Objective:    Physical Exam  Constitutional: She is oriented to person, place, and  time. She appears well-developed and well-nourished.  HENT:  Head: Normocephalic and atraumatic.  Cardiovascular: Normal rate, regular rhythm and normal heart sounds.  Pulmonary/Chest: Effort normal and breath sounds normal.  Neurological: She is alert and oriented to person, place, and time.  Skin: Skin is warm and dry.  Psychiatric: She has a normal mood and affect. Her behavior is normal.    BP 133/74   Pulse 78   Ht 5\' 7"  (1.702 m)   Wt 236 lb (107 kg)   SpO2 99%   BMI 36.96 kg/m  Wt Readings from Last 3 Encounters:  01/23/20 236 lb (107 kg)  11/20/19 238 lb (108 kg)  10/31/19 236 lb (107 kg)     There are no preventive care reminders to display for this patient.  There are no preventive care reminders to display for this patient.  Lab Results  Component Value Date   TSH 1.19 11/03/2019   Lab Results  Component Value Date   WBC 8.7 11/03/2019   HGB 12.4 11/03/2019   HCT 38.5 11/03/2019   MCV 95.1 11/03/2019   PLT 335 11/03/2019   Lab Results  Component Value Date   NA 143 11/03/2019   K 3.9 11/03/2019   CO2 25 11/03/2019   GLUCOSE 113 (H) 11/03/2019   BUN 12 11/03/2019   CREATININE 0.80 11/03/2019   BILITOT 0.4 11/03/2019   AST 14 11/03/2019   ALT 9 11/03/2019   PROT 6.9 11/03/2019   CALCIUM 9.4 11/03/2019   Lab Results  Component Value Date   CHOL 130 06/23/2019   Lab Results  Component Value Date   HDL 43 (L) 06/23/2019   Lab Results  Component Value Date   LDLCALC 69 06/23/2019   Lab Results  Component Value Date   TRIG 92 06/23/2019   Lab Results  Component Value Date   CHOLHDL 3.0 06/23/2019   Lab Results  Component Value Date   HGBA1C 5.0 11/03/2019      Assessment & Plan:   Problem List Items Addressed This Visit      Endocrine   PCOS (polycystic ovarian syndrome)    Start Metformin 3 times daily.  Okay to taper up and we discussed how to do that.      Relevant Medications   metFORMIN (GLUCOPHAGE) 500 MG tablet    Other Relevant Orders   US Pelvic Complete With Transvaginal     Other   Irregular periods    She would really like to get an ultrasound just to make sure that everything  is okay actually think this would be worthwhile just to make sure she does not have any thickening of the lining of the uterus especially with having very little menstrual bleeding over the last 6 months.  That can increase risks of endometrial cancer especially since her BMI is 36 and she has PCOS.      Relevant Orders   US Pelvic Complete With Transvaginal   Acute stress reaction - Primary    He is really doing fantastic on the Lexapro I think it is been a really great fit for her.  We discussed following back up in 4 to 6 months.  22-month supply sent to the pharmacy.  If she has any problems or concerns and please just give [place] call back.      Relevant Medications   escitalopram (LEXAPRO) 10 MG tablet      Meds ordered this encounter  Medications  . metFORMIN (GLUCOPHAGE) 500 MG tablet    Sig: Take 1 tablet (500 mg total) by mouth 3 (three) times daily.    Dispense:  270 tablet    Refill:  1  . escitalopram (LEXAPRO) 10 MG tablet    Sig: Take 1 tablet (10 mg total) by mouth daily.    Dispense:  90 tablet    Refill:  1    Follow-up: Return in about 4 months (around 05/24/2020) for Mood.  Nani Gasser, MD

## 2020-01-23 NOTE — Assessment & Plan Note (Signed)
Start Metformin 3 times daily.  Okay to taper up and we discussed how to do that.

## 2020-01-23 NOTE — Assessment & Plan Note (Signed)
She would really like to get an ultrasound just to make sure that everything is okay actually think this would be worthwhile just to make sure she does not have any thickening of the lining of the uterus especially with having very little menstrual bleeding over the last 6 months.  That can increase risks of endometrial cancer especially since her BMI is 36 and she has PCOS.

## 2020-01-23 NOTE — Assessment & Plan Note (Signed)
He is really doing fantastic on the Lexapro I think it is been a really great fit for her.  We discussed following back up in 4 to 6 months.  79-month supply sent to the pharmacy.  If she has any problems or concerns and please just give [place] call back.

## 2020-02-02 ENCOUNTER — Other Ambulatory Visit: Payer: Managed Care, Other (non HMO)

## 2020-02-09 ENCOUNTER — Other Ambulatory Visit: Payer: Self-pay

## 2020-02-09 ENCOUNTER — Encounter: Payer: Self-pay | Admitting: Family Medicine

## 2020-02-09 ENCOUNTER — Ambulatory Visit (INDEPENDENT_AMBULATORY_CARE_PROVIDER_SITE_OTHER): Payer: Managed Care, Other (non HMO)

## 2020-02-09 DIAGNOSIS — N926 Irregular menstruation, unspecified: Secondary | ICD-10-CM

## 2020-02-09 DIAGNOSIS — E282 Polycystic ovarian syndrome: Secondary | ICD-10-CM | POA: Diagnosis not present

## 2020-05-24 ENCOUNTER — Ambulatory Visit: Payer: Managed Care, Other (non HMO) | Admitting: Family Medicine

## 2020-06-08 ENCOUNTER — Encounter: Payer: Self-pay | Admitting: Family Medicine

## 2020-06-08 ENCOUNTER — Emergency Department
Admission: EM | Admit: 2020-06-08 | Discharge: 2020-06-08 | Disposition: A | Discharge status: Home / Self Care | Payer: Managed Care, Other (non HMO) | Source: Home / Self Care | Attending: Family Medicine | Admitting: Family Medicine

## 2020-06-08 ENCOUNTER — Encounter: Payer: Self-pay | Admitting: Emergency Medicine

## 2020-06-08 ENCOUNTER — Other Ambulatory Visit: Payer: Self-pay

## 2020-06-08 DIAGNOSIS — M940 Chondrocostal junction syndrome [Tietze]: Secondary | ICD-10-CM | POA: Diagnosis not present

## 2020-06-08 DIAGNOSIS — F411 Generalized anxiety disorder: Secondary | ICD-10-CM

## 2020-06-08 MED ORDER — PREDNISONE 20 MG PO TABS: ORAL_TABLET | ORAL | 0 refills | Status: DC

## 2020-06-08 NOTE — ED Provider Notes (Signed)
[facility]    CSN: [id number] Arrival date & time: 06/08/20  1359      History   Chief Complaint Chief Complaint  Patient presents with  . Chest Pain    x 1 week     HPI Alyssa Sparks is a 29 y.o. female.   Patient complains of persistent pain in her anterior chest for about a week.  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  Pain occasionally radiates to her left arm.  She denies shortness of breath, lower leg pain, cough.  She notes that the pain began occurring after she recently increased her work hours. She states that she has a history of anxiety which had been controlled by Lexapro.  She feels that her anxiety is recurring after discontinuing the Lexapro, and wonders if anxiety could be contributing to her chest pain.  The history is provided by the patient.  Chest Pain Pain location:  Substernal area Pain quality: stabbing   Pain radiates to:  L arm Pain severity:  Moderate Onset quality:  Gradual Duration:  1 week Timing:  Sporadic Progression:  Unchanged Chronicity:  New Context: lifting, movement and raising an arm   Relieved by:  Nothing Exacerbated by: Lifting and pulling heavy patients at work. Ineffective treatments:  None tried Associated symptoms: anxiety   Associated symptoms: no abdominal pain, no AICD problem, no anorexia, no back pain, no cough, no diaphoresis, no dizziness, no dysphagia, no fatigue, no fever, no headache, no heartburn, no lower extremity edema, no nausea, no numbness, no palpitations, no PND, no shortness of breath, no syncope and no weakness   Risk factors: obesity     Past Medical History:  Diagnosis Date  . Broken arm    RIGHT  . Polycystic ovarian disease     Patient Active Problem List   Diagnosis Date Noted  . Acne 11/20/2019  . Acute stress reaction 11/01/2019  . Irregular periods 06/23/2019   . Hirsutism 06/23/2019  . Obesity with body mass index 30 or greater 06/23/2019  . Blurred vision, right eye 10/02/2016  . Pendulous breast 06/02/2016  . Macromastia 06/02/2016  . Chronic bilateral low back pain without sciatica 06/02/2016  . PCOS (polycystic ovarian syndrome) 10/14/2015  . Menometrorrhagia 01/24/2012    History reviewed. No pertinent surgical history.  OB History    Gravida  0   Para  0   Term      Preterm      AB      Living  0     SAB      TAB      Ectopic      Multiple      Live Births               Home Medications    Prior to Admission medications   Medication Sig Start Date End Date Taking? Authorizing Provider  metFORMIN (GLUCOPHAGE) 500 MG tablet Take 1 tablet (500 mg total) by mouth 3 (three) times daily. 01/23/20  Yes Agapito Games, MD  Multiple Vitamins-Calcium (ONE-A-DAY WOMENS FORMULA PO) Take 1 tablet by mouth daily.   Yes [provider]  AMBULATORY NON FORMULARY MEDICATION Take 4 tablets by mouth 2 (two) times daily. Medication Name: Myo-D+ inositol. 2 tablets in the morning 2 tablets in evening.    [provider]  escitalopram (LEXAPRO) 10 MG tablet Take  1 tablet (10 mg total) by mouth daily. 01/23/20   Agapito Games, MD  predniSONE (DELTASONE) 20 MG tablet Take one tab by mouth twice daily for 4 days, then one daily. Take with food. 06/08/20   Lattie Haw, MD    Family History Family History  Problem Relation Age of Onset  . Breast cancer Other   . Hypertension Mother   . Diabetes Mother   . Heart attack Father   . Diabetes Maternal Grandmother   . Hypertension Maternal Grandmother   . Deep vein thrombosis Maternal Grandmother   . Diabetes Maternal Grandfather   . Hypertension Maternal Grandfather   . Prostate cancer Maternal Grandfather   . Cancer Maternal Grandfather        PROSTATE    Social History Social History   Tobacco Use  . Smoking status: Never Smoker  .  Smokeless tobacco: Never Used  Substance Use Topics  . Alcohol use: No  . Drug use: No     Allergies   Patient has no known allergies.   Review of Systems Review of Systems  Constitutional: Negative for diaphoresis, fatigue and fever.  HENT: Negative for trouble swallowing.   Respiratory: Negative for cough and shortness of breath.   Cardiovascular: Positive for chest pain. Negative for palpitations, leg swelling, syncope and PND.  Gastrointestinal: Negative for abdominal pain, anorexia, heartburn and nausea.  Musculoskeletal: Negative for back pain.  Neurological: Negative for dizziness, weakness, numbness and headaches.  Psychiatric/Behavioral: The patient is nervous/anxious.   All other systems reviewed and are negative.    Physical Exam Triage Vital Signs ED Triage Vitals  Enc Vitals Group     BP 06/08/20 1515 125/84     Pulse Rate 06/08/20 1515 80     Resp 06/08/20 1515 15     Temp 06/08/20 1515 98.7 F (37.1 C)     Temp Source 06/08/20 1515 Oral     SpO2 06/08/20 1515 99 %     Weight 06/08/20 1516 237 lb (107.5 kg)     Height 06/08/20 1516 5\' 7"  (1.702 m)     Head Circumference --      Peak Flow --      Pain Score --      Pain Loc --      Pain Edu? --      Excl. in GC? --    No data found.  Updated Vital Signs BP 125/84 (BP Location: Right Arm)   Pulse 80   Temp 98.7 F (37.1 C) (Oral)   Resp 15   Ht 5\' 7"  (1.702 m)   Wt 107.5 kg   LMP 04/03/2020 (Exact Date)   SpO2 99%   BMI 37.12 kg/m   Visual Acuity Right Eye Distance:   Left Eye Distance:   Bilateral Distance:    Right Eye Near:   Left Eye Near:    Bilateral Near:     Physical Exam Vitals and nursing note reviewed.  Constitutional:      General: She is not in acute distress.    Appearance: She is obese.  HENT:     Head: Normocephalic.     Right Ear: External ear normal.     Left Ear: External ear normal.     Nose: Nose normal.     Mouth/Throat:     Pharynx: Oropharynx is clear.   Eyes:     Conjunctiva/sclera: Conjunctivae normal.     Pupils: Pupils are equal, round, and reactive to light.  Cardiovascular:     Rate and Rhythm: Normal rate and regular rhythm.     Heart sounds: Normal heart sounds.  Pulmonary:     Breath sounds: Normal breath sounds.  Chest:     Chest wall: Tenderness present.       Comments: Chest:  Distinct tenderness to palpation over the upper sternum, especially at the left second interspace.  Palpation there recreates her chest pain.  Pain is also elicited by palpation over resisted contraction of the left pectoralis muscle. Abdominal:     Palpations: Abdomen is soft.     Tenderness: There is no abdominal tenderness.  Musculoskeletal:        General: No tenderness.     Cervical back: Normal range of motion.     Right lower leg: No edema.     Left lower leg: No edema.  Lymphadenopathy:     Cervical: No cervical adenopathy.  Skin:    General: Skin is warm and dry.     Findings: No rash.  Neurological:     Mental Status: She is alert.  Psychiatric:        Mood and Affect: Mood and affect normal. Mood is not anxious.      UC Treatments / Results  Labs (all labs ordered are listed, but only abnormal results are displayed) Labs Reviewed - No data to display  EKG  Rate:  82 BPM PR:  162 msec QT:  370 msec QTcH:  432 msec QRSD:  86 msec QRS axis:  22 degrees Interpretation:  Within normal limits; normal sinus rhythm.  No acute changes.  Radiology No results found.  Procedures Procedures (including critical care time)  Medications Ordered in UC Medications - No data to display  Initial Impression / Assessment and Plan / UC Course  I have reviewed the triage vital signs and the nursing notes.  Pertinent labs & imaging results that were available during my care of the patient were reviewed by me and considered in my medical decision making (see chart for details).    Normal EKG reassuring. Begin prednisone  burst/taper. Followup with Dr[facility] if not improving about two weeks.  Note recurrent anxiety.  Recommend follow-up with PCP for anxiety management.  Final Clinical Impressions(s) / UC Diagnoses   Final diagnoses:  Costochondritis  Anxiety state     Discharge Instructions     Apply ice pack for 20 to 30 minutes, 3 to 4 times daily  Continue until pain and swelling decrease.  After finishing prednisone, may take Ibuprofen 200mg , 4 tabs every 8 hours with food as needed.    ED Prescriptions    Medication Sig Dispense Auth. Provider   predniSONE (DELTASONE) 20 MG tablet Take one tab by mouth twice daily for 4 days, then one daily. Take with food. 11 tablet , MD        Lattie Haw, MD 06/10/20 [phone]

## 2020-06-08 NOTE — Telephone Encounter (Signed)
Patient also called. States her last BP reading was 118/73 and it has been consistently in that range, but she does have some complaints of occasional left arm pain. Stated she has had some headaches but they are resolved when she eats. I advised that she at least needs to seek evaluation at at urgent care. Patient agreeable and will seek care today.   FYI to PCP

## 2020-06-08 NOTE — Telephone Encounter (Signed)
Agree with documentation as above.   Sebastin Perlmutter, MD  

## 2020-06-08 NOTE — Discharge Instructions (Addendum)
Apply ice pack for 20 to 30 minutes, 3 to 4 times daily  Continue until pain and swelling decrease.  After finishing prednisone, may take Ibuprofen 200mg , 4 tabs every 8 hours with food as needed.

## 2020-06-08 NOTE — ED Triage Notes (Signed)
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

## 2020-10-13 ENCOUNTER — Other Ambulatory Visit: Payer: Self-pay | Admitting: Obstetrics & Gynecology

## 2020-10-13 DIAGNOSIS — N979 Female infertility, unspecified: Secondary | ICD-10-CM

## 2020-10-18 ENCOUNTER — Other Ambulatory Visit: Payer: Self-pay

## 2020-10-18 ENCOUNTER — Ambulatory Visit
Admission: RE | Admit: 2020-10-18 | Discharge: 2020-10-18 | Disposition: A | Discharge status: Home / Self Care | Payer: Managed Care, Other (non HMO) | Source: Ambulatory Visit | Attending: Obstetrics & Gynecology | Admitting: Obstetrics & Gynecology

## 2020-10-18 DIAGNOSIS — N979 Female infertility, unspecified: Secondary | ICD-10-CM

## 2020-10-28 ENCOUNTER — Encounter: Payer: Self-pay | Admitting: Family Medicine

## 2020-10-28 ENCOUNTER — Other Ambulatory Visit: Payer: Self-pay

## 2020-10-28 ENCOUNTER — Ambulatory Visit: Payer: Managed Care, Other (non HMO) | Admitting: Family Medicine

## 2020-10-28 VITALS — BP 131/73 | HR 91 | Ht 67.0 in | Wt 238.0 lb

## 2020-10-28 DIAGNOSIS — Z0000 Encounter for general adult medical examination without abnormal findings: Secondary | ICD-10-CM | POA: Diagnosis not present

## 2020-10-28 DIAGNOSIS — Z111 Encounter for screening for respiratory tuberculosis: Secondary | ICD-10-CM | POA: Diagnosis not present

## 2020-10-28 DIAGNOSIS — E282 Polycystic ovarian syndrome: Secondary | ICD-10-CM | POA: Diagnosis not present

## 2020-10-28 DIAGNOSIS — Z1159 Encounter for screening for other viral diseases: Secondary | ICD-10-CM | POA: Diagnosis not present

## 2020-10-28 DIAGNOSIS — Z23 Encounter for immunization: Secondary | ICD-10-CM

## 2020-10-28 MED ORDER — METFORMIN HCL 500 MG PO TABS: 500.0000 mg | ORAL_TABLET | Freq: Three times a day (TID) | ORAL | 1 refills | Status: DC

## 2020-10-28 NOTE — Progress Notes (Signed)
Established Patient Office Visit  Subjective:  Patient ID: Alyssa Sparks, female    DOB: [birth year]  Age: 30 y.o. MRN: [id number]  CC:  Chief Complaint  Patient presents with  . Annual Exam    HPI Alyssa Sparks presents for CPE.  She is applying for a nursing LPN program and has a forms she would like completed.  She is not exercising right now.  She is o/w doing well.    She is going to be working with a Financial trader.    Past Medical History:  Diagnosis Date  . Broken arm    RIGHT  . Polycystic ovarian disease     History reviewed. No pertinent surgical history.  Family History  Problem Relation Age of Onset  . Breast cancer Other   . Hypertension Mother   . Diabetes Mother   . Heart attack Father   . Diabetes Maternal Grandmother   . Hypertension Maternal Grandmother   . Deep vein thrombosis Maternal Grandmother   . Diabetes Maternal Grandfather   . Hypertension Maternal Grandfather   . Prostate cancer Maternal Grandfather   . Cancer Maternal Grandfather        PROSTATE    Social History   Socioeconomic History  . Marital status: Single    Spouse name: Not on file  . Number of children: Not on file  . Years of education: Not on file  . Highest education level: Not on file  Occupational History  . Occupation: CNA/MED-TECH  Tobacco Use  . Smoking status: Never Smoker  . Smokeless tobacco: Never Used  Substance and Sexual Activity  . Alcohol use: No  . Drug use: No  . Sexual activity: Yes    Birth control/protection: None    Comment: WAS ON B/C STOP ON HER OWN  Other Topics Concern  . Not on file  Social History Narrative  . Not on file   Social Determinants of Health   Financial Resource Strain: Not on file  Food Insecurity: Not on file  Transportation Needs: Not on file  Physical Activity: Not on file  Stress: Not on file  Social Connections: Not on file  Intimate Partner Violence: Not on file    Outpatient Medications Prior to Visit   Medication Sig Dispense Refill  . Multiple Vitamins-Calcium (ONE-A-DAY WOMENS FORMULA PO) Take 1 tablet by mouth daily.    . metFORMIN (GLUCOPHAGE) 500 MG tablet Take 1 tablet (500 mg total) by mouth 3 (three) times daily. 270 tablet 1  . AMBULATORY NON FORMULARY MEDICATION Take 4 tablets by mouth 2 (two) times daily. Medication Name: Myo-D+ inositol. 2 tablets in the morning 2 tablets in evening.    . escitalopram (LEXAPRO) 10 MG tablet Take 1 tablet (10 mg total) by mouth daily. 90 tablet 1  . predniSONE (DELTASONE) 20 MG tablet Take one tab by mouth twice daily for 4 days, then one daily. Take with food. 11 tablet 0   No facility-administered medications prior to visit.    No Known Allergies  ROS Review of Systems    Objective:    Physical Exam Constitutional:      Appearance: She is well-developed and well-nourished.  HENT:     Head: Normocephalic and atraumatic.     Right Ear: Tympanic membrane, ear canal and external ear normal.     Left Ear: Tympanic membrane, ear canal and external ear normal.     Nose: Nose normal.     Mouth/Throat:     Mouth: Mucous  membranes are moist.     Pharynx: Oropharynx is clear. No oropharyngeal exudate or posterior oropharyngeal erythema.  Eyes:     Conjunctiva/sclera: Conjunctivae normal.     Pupils: Pupils are equal, round, and reactive to light.  Neck:     Thyroid: No thyromegaly.  Cardiovascular:     Rate and Rhythm: Normal rate and regular rhythm.     Heart sounds: Normal heart sounds.  Pulmonary:     Effort: Pulmonary effort is normal.     Breath sounds: Normal breath sounds. No wheezing.  Abdominal:     General: Abdomen is flat.     Palpations: Abdomen is soft.  Musculoskeletal:     Cervical back: Neck supple.  Lymphadenopathy:     Cervical: No cervical adenopathy.  Skin:    General: Skin is warm and dry.  Neurological:     Mental Status: She is alert and oriented to person, place, and time.  Psychiatric:        Mood and  Affect: Mood and affect normal.        Behavior: Behavior normal.     BP 131/73   Pulse 91   Ht 5\' 7"  (1.702 m)   Wt 238 lb (108 kg)   LMP 10/10/2020 (Exact Date)   SpO2 99%   BMI 37.28 kg/m  Wt Readings from Last 3 Encounters:  10/28/20 238 lb (108 kg)  06/08/20 237 lb (107.5 kg)  01/23/20 236 lb (107 kg)     Health Maintenance Due  Topic Date Due  . Hepatitis C Screening  Never done    There are no preventive care reminders to display for this patient.  Lab Results  Component Value Date   TSH 1.19 11/03/2019   Lab Results  Component Value Date   WBC 8.7 11/03/2019   HGB 12.4 11/03/2019   HCT 38.5 11/03/2019   MCV 95.1 11/03/2019   PLT 335 11/03/2019   Lab Results  Component Value Date   NA 143 11/03/2019   K 3.9 11/03/2019   CO2 25 11/03/2019   GLUCOSE 113 (H) 11/03/2019   BUN 12 11/03/2019   CREATININE 0.80 11/03/2019   BILITOT 0.4 11/03/2019   AST 14 11/03/2019   ALT 9 11/03/2019   PROT 6.9 11/03/2019   CALCIUM 9.4 11/03/2019   Lab Results  Component Value Date   CHOL 130 06/23/2019   Lab Results  Component Value Date   HDL 43 (L) 06/23/2019   Lab Results  Component Value Date   LDLCALC 69 06/23/2019   Lab Results  Component Value Date   TRIG 92 06/23/2019   Lab Results  Component Value Date   CHOLHDL 3.0 06/23/2019   Lab Results  Component Value Date   HGBA1C 5.0 11/03/2019      Assessment & Plan:   Problem List Items Addressed This Visit      Endocrine   PCOS (polycystic ovarian syndrome)   Relevant Medications   metFORMIN (GLUCOPHAGE) 500 MG tablet   Other Relevant Orders   HgB A1c    Other Visit Diagnoses    Routine general medical examination at a health care facility    -  Primary   Relevant Orders   QuantiFERON-TB Gold Plus   COMPLETE METABOLIC PANEL WITH GFR   Lipid Panel w/reflex Direct LDL   HgB A1c   CBC   TSH   Fe+TIBC+Fer   Hepatitis C antibody   Screening-pulmonary TB       Relevant Orders  QuantiFERON-TB Gold Plus   Need for hepatitis C screening test       Relevant Orders   Hepatitis C antibody   Need for tetanus, diphtheria, and acellular pertussis (Tdap) vaccine in patient of adolescent age or older       Relevant Orders   Tdap vaccine greater than or equal to 7yo IM (Completed)      Meds ordered this encounter  Medications  . metFORMIN (GLUCOPHAGE) 500 MG tablet    Sig: Take 1 tablet (500 mg total) by mouth 3 (three) times daily.    Dispense:  270 tablet    Refill:  1    Follow-up: Return in about 1 year (around 10/28/2021) for Wellness Exam.    Nani Gasser, MD

## 2020-10-28 NOTE — Patient Instructions (Signed)
Health Maintenance, Female Adopting a healthy lifestyle and getting preventive care are important in promoting health and wellness. Ask your health care provider about:  The right schedule for you to have regular tests and exams.  Things you can do on your own to prevent diseases and keep yourself healthy. What should I know about diet, weight, and exercise? Eat a healthy diet  Eat a diet that includes plenty of vegetables, fruits, low-fat dairy products, and lean protein.  Do not eat a lot of foods that are high in solid fats, added sugars, or sodium.   Maintain a healthy weight Body mass index (BMI) is used to identify weight problems. It estimates body fat based on height and weight. Your health care provider can help determine your BMI and help you achieve or maintain a healthy weight. Get regular exercise Get regular exercise. This is one of the most important things you can do for your health. Most adults should:  Exercise for at least 150 minutes each week. The exercise should increase your heart rate and make you sweat (moderate-intensity exercise).  Do strengthening exercises at least twice a week. This is in addition to the moderate-intensity exercise.  Spend less time sitting. Even light physical activity can be beneficial. Watch cholesterol and blood lipids Have your blood tested for lipids and cholesterol at 29 years of age, then have this test every 5 years. Have your cholesterol levels checked more often if:  Your lipid or cholesterol levels are high.  You are older than 29 years of age.  You are at high risk for heart disease. What should I know about cancer screening? Depending on your health history and family history, you may need to have cancer screening at various ages. This may include screening for:  Breast cancer.  Cervical cancer.  Colorectal cancer.  Skin cancer.  Lung cancer. What should I know about heart disease, diabetes, and high blood  pressure? Blood pressure and heart disease  High blood pressure causes heart disease and increases the risk of stroke. This is more likely to develop in people who have high blood pressure readings, are of African descent, or are overweight.  Have your blood pressure checked: ? Every 3-5 years if you are 18-39 years of age. ? Every year if you are 40 years old or older. Diabetes Have regular diabetes screenings. This checks your fasting blood sugar level. Have the screening done:  Once every three years after age 40 if you are at a normal weight and have a low risk for diabetes.  More often and at a younger age if you are overweight or have a high risk for diabetes. What should I know about preventing infection? Hepatitis B If you have a higher risk for hepatitis B, you should be screened for this virus. Talk with your health care provider to find out if you are at risk for hepatitis B infection. Hepatitis C Testing is recommended for:  Everyone born from 1945 through 1965.  Anyone with known risk factors for hepatitis C. Sexually transmitted infections (STIs)  Get screened for STIs, including gonorrhea and chlamydia, if: ? You are sexually active and are younger than 29 years of age. ? You are older than 29 years of age and your health care provider tells you that you are at risk for this type of infection. ? Your sexual activity has changed since you were last screened, and you are at increased risk for chlamydia or gonorrhea. Ask your health care provider   if you are at risk.  Ask your health care provider about whether you are at high risk for HIV. Your health care provider may recommend a prescription medicine to help prevent HIV infection. If you choose to take medicine to prevent HIV, you should first get tested for HIV. You should then be tested every 3 months for as long as you are taking the medicine. Pregnancy  If you are about to stop having your period (premenopausal) and  you may become pregnant, seek counseling before you get pregnant.  Take 400 to 800 micrograms (mcg) of folic acid every day if you become pregnant.  Ask for birth control (contraception) if you want to prevent pregnancy. Osteoporosis and menopause Osteoporosis is a disease in which the bones lose minerals and strength with aging. This can result in bone fractures. If you are 65 years old or older, or if you are at risk for osteoporosis and fractures, ask your health care provider if you should:  Be screened for bone loss.  Take a calcium or vitamin D supplement to lower your risk of fractures.  Be given hormone replacement therapy (HRT) to treat symptoms of menopause. Follow these instructions at home: Lifestyle  Do not use any products that contain nicotine or tobacco, such as cigarettes, e-cigarettes, and chewing tobacco. If you need help quitting, ask your health care provider.  Do not use street drugs.  Do not share needles.  Ask your health care provider for help if you need support or information about quitting drugs. Alcohol use  Do not drink alcohol if: ? Your health care provider tells you not to drink. ? You are pregnant, may be pregnant, or are planning to become pregnant.  If you drink alcohol: ? Limit how much you use to 0-1 drink a day. ? Limit intake if you are breastfeeding.  Be aware of how much alcohol is in your drink. In the [place] one drink equals one 12 oz bottle of beer (355 mL), one 5 oz glass of wine (148 mL), or one 1 oz glass of hard liquor (44 mL). General instructions  Schedule regular health, dental, and eye exams.  Stay current with your vaccines.  Tell your health care provider if: ? You often feel depressed. ? You have ever been abused or do not feel safe at home. Summary  Adopting a healthy lifestyle and getting preventive care are important in promoting health and wellness.  Follow your health care provider's instructions about healthy  diet, exercising, and getting tested or screened for diseases.  Follow your health care provider's instructions on monitoring your cholesterol and blood pressure. This information is not intended to replace advice given to you by your health care provider. Make sure you discuss any questions you have with your health care provider. Document Revised: 09/04/2018 Document Reviewed: 09/04/2018 Elsevier Patient Education  2021 Elsevier Inc.  

## 2020-11-01 ENCOUNTER — Encounter: Payer: Self-pay | Admitting: Family Medicine

## 2020-11-01 LAB — TSH: TSH: 0.92 mIU/L

## 2020-11-01 LAB — IRON,TIBC AND FERRITIN PANEL
%SAT: 20 % (calc) (ref 16–45)
Ferritin: 77 ng/mL (ref 16–154)
Iron: 65 ug/dL (ref 40–190)
TIBC: 323 mcg/dL (calc) (ref 250–450)

## 2020-11-01 LAB — COMPLETE METABOLIC PANEL WITH GFR
AG Ratio: 1.6 (calc) (ref 1.0–2.5)
ALT: 9 U/L (ref 6–29)
AST: 17 U/L (ref 10–30)
Albumin: 4.6 g/dL (ref 3.6–5.1)
Alkaline phosphatase (APISO): 76 U/L (ref 31–125)
BUN: 10 mg/dL (ref 7–25)
CO2: 27 mmol/L (ref 20–32)
Calcium: 9.7 mg/dL (ref 8.6–10.2)
Chloride: 105 mmol/L (ref 98–110)
Creat: 0.61 mg/dL (ref 0.50–1.10)
GFR, Est African American: 143 mL/min/{1.73_m2} (ref >=60)
GFR, Est Non African American: 123 mL/min/{1.73_m2} (ref >=60)
Globulin: 2.9 g/dL (calc) (ref 1.9–3.7)
Glucose, Bld: 88 mg/dL (ref 65–99)
Potassium: 4 mmol/L (ref 3.5–5.3)
Sodium: 140 mmol/L (ref 135–146)
Total Bilirubin: 0.4 mg/dL (ref 0.2–1.2)
Total Protein: 7.5 g/dL (ref 6.1–8.1)

## 2020-11-01 LAB — QUANTIFERON-TB GOLD PLUS
Mitogen-NIL: >10 IU/mL
NIL: 0.03 IU/mL
QuantiFERON-TB Gold Plus: NEGATIVE
TB1-NIL: <0 IU/mL
TB2-NIL: <0 IU/mL

## 2020-11-01 LAB — LIPID PANEL W/REFLEX DIRECT LDL
Cholesterol: 148 mg/dL (ref <200)
HDL: 45 mg/dL — ABNORMAL LOW (ref >=50)
LDL Cholesterol (Calc): 88 mg/dL (calc)
Non-HDL Cholesterol (Calc): 103 mg/dL (calc) (ref <130)
Total CHOL/HDL Ratio: 3.3 (calc) (ref <5.0)
Triglycerides: 60 mg/dL (ref <150)

## 2020-11-01 LAB — HEMOGLOBIN A1C
Hgb A1c MFr Bld: 5.1 % of total Hgb (ref <5.7)
Mean Plasma Glucose: 100 mg/dL
eAG (mmol/L): 5.5 mmol/L

## 2020-11-01 LAB — HEPATITIS C ANTIBODY
Hepatitis C Ab: NONREACTIVE
SIGNAL TO CUT-OFF: 0.03 (ref <1.00)

## 2020-11-01 LAB — CBC
HCT: 38.4 % (ref 35.0–45.0)
Hemoglobin: 12.5 g/dL (ref 11.7–15.5)
MCH: 30.6 pg (ref 27.0–33.0)
MCHC: 32.6 g/dL (ref 32.0–36.0)
MCV: 93.9 fL (ref 80.0–100.0)
MPV: 10.2 fL (ref 7.5–12.5)
Platelets: 307 10*3/uL (ref 140–400)
RBC: 4.09 10*6/uL (ref 3.80–5.10)
RDW: 11 % (ref 11.0–15.0)
WBC: 6.5 10*3/uL (ref 3.8–10.8)

## 2020-12-07 ENCOUNTER — Encounter: Payer: Self-pay | Admitting: Family Medicine

## 2020-12-08 MED ORDER — ONDANSETRON 4 MG PO TBDP: 4.0000 mg | ORAL_TABLET | Freq: Three times a day (TID) | ORAL | 0 refills | Status: DC | PRN

## 2021-03-16 LAB — OB RESULTS CONSOLE RPR: RPR: NONREACTIVE

## 2021-03-16 LAB — OB RESULTS CONSOLE HIV ANTIBODY (ROUTINE TESTING): HIV: NONREACTIVE

## 2021-03-16 LAB — OB RESULTS CONSOLE ABO/RH: RH Type: POSITIVE

## 2021-03-16 LAB — HM PAP SMEAR: HM Pap smear: NORMAL

## 2021-03-16 LAB — OB RESULTS CONSOLE HEPATITIS B SURFACE ANTIGEN: Hepatitis B Surface Ag: NEGATIVE

## 2021-03-16 LAB — OB RESULTS CONSOLE ANTIBODY SCREEN: Antibody Screen: NEGATIVE

## 2021-03-16 LAB — OB RESULTS CONSOLE RUBELLA ANTIBODY, IGM: Rubella: IMMUNE

## 2021-03-16 LAB — HEPATITIS C ANTIBODY: HCV Ab: NEGATIVE

## 2021-05-10 ENCOUNTER — Encounter: Payer: Self-pay | Admitting: Family Medicine

## 2021-05-23 ENCOUNTER — Other Ambulatory Visit (HOSPITAL_COMMUNITY)
Admission: RE | Admit: 2021-05-23 | Discharge: 2021-05-23 | Disposition: A | Discharge status: Home / Self Care | Payer: Managed Care, Other (non HMO) | Source: Ambulatory Visit | Attending: Family Medicine | Admitting: Family Medicine

## 2021-05-23 ENCOUNTER — Emergency Department (INDEPENDENT_AMBULATORY_CARE_PROVIDER_SITE_OTHER)
Admission: EM | Admit: 2021-05-23 | Discharge: 2021-05-23 | Disposition: A | Discharge status: Home / Self Care | Payer: Managed Care, Other (non HMO) | Source: Home / Self Care | Attending: Family Medicine | Admitting: Family Medicine

## 2021-05-23 ENCOUNTER — Other Ambulatory Visit: Payer: Self-pay

## 2021-05-23 ENCOUNTER — Encounter: Payer: Self-pay | Admitting: *Deleted

## 2021-05-23 DIAGNOSIS — R300 Dysuria: Secondary | ICD-10-CM | POA: Diagnosis not present

## 2021-05-23 DIAGNOSIS — N949 Unspecified condition associated with female genital organs and menstrual cycle: Secondary | ICD-10-CM | POA: Diagnosis not present

## 2021-05-23 DIAGNOSIS — O23592 Infection of other part of genital tract in pregnancy, second trimester: Secondary | ICD-10-CM | POA: Insufficient documentation

## 2021-05-23 DIAGNOSIS — B373 Candidiasis of vulva and vagina: Secondary | ICD-10-CM | POA: Diagnosis not present

## 2021-05-23 DIAGNOSIS — Z3A18 18 weeks gestation of pregnancy: Secondary | ICD-10-CM

## 2021-05-23 DIAGNOSIS — O26892 Other specified pregnancy related conditions, second trimester: Secondary | ICD-10-CM | POA: Diagnosis present

## 2021-05-23 LAB — POCT URINALYSIS DIP (MANUAL ENTRY)
Bilirubin, UA: NEGATIVE
Glucose, UA: NEGATIVE mg/dL
Nitrite, UA: NEGATIVE
Protein Ur, POC: NEGATIVE mg/dL
Spec Grav, UA: 1.025 (ref 1.010–1.025)
Urobilinogen, UA: 2 E.U./dL — AB
pH, UA: 6 (ref 5.0–8.0)

## 2021-05-23 NOTE — ED Provider Notes (Signed)
[facility]    CSN: [id number] Arrival date & time: 05/23/21  0804      History   Chief Complaint Chief Complaint  Patient presents with   Vaginal Itching    HPI Alyssa Sparks is a 30 y.o. female.   HPI  Patient is here for vaginal discomfort and dysuria.  She states that on one of her prenatal visits she was found to have BV.  She was treated with a week of metronidazole.  During her first trimester pregnancy she had a lot of vomiting and she is not certain her BV was treated adequately.  She now has discomfort with sexual relations, some vaginal irritation, and some dysuria.  She would like to be screened for infection.  No abdominal pain.  No vaginal bleeding.  No fever or chills  Past Medical History:  Diagnosis Date   Broken arm    RIGHT   Polycystic ovarian disease     Patient Active Problem List   Diagnosis Date Noted   Acne 11/20/2019   Acute stress reaction 11/01/2019   Irregular periods 06/23/2019   Hirsutism 06/23/2019   Obesity with body mass index 30 or greater 06/23/2019   Blurred vision, right eye 10/02/2016   Pendulous breast 06/02/2016   Macromastia 06/02/2016   Chronic bilateral low back pain without sciatica 06/02/2016   PCOS (polycystic ovarian syndrome) 10/14/2015   Menometrorrhagia 01/24/2012    History reviewed. No pertinent surgical history.  OB History     Gravida  1   Para  0   Term      Preterm      AB      Living  0      SAB      IAB      Ectopic      Multiple      Live Births               Home Medications    Prior to Admission medications   Medication Sig Start Date End Date Taking? Authorizing Provider  prenatal vitamin w/FE, FA (PRENATAL 1 + 1) 27-1 MG TABS tablet Take 1 tablet by mouth daily at 12 noon.   Yes [provider]  ondansetron (ZOFRAN ODT) 4 MG disintegrating tablet Take 1 tablet (4 mg total) by mouth every 8 (eight) hours as needed for nausea or vomiting. 12/08/20    Agapito Games, MD    Family History Family History  Problem Relation Age of Onset   Breast cancer Other    Hypertension Mother    Diabetes Mother    Heart attack Father    Diabetes Maternal Grandmother    Hypertension Maternal Grandmother    Deep vein thrombosis Maternal Grandmother    Diabetes Maternal Grandfather    Hypertension Maternal Grandfather    Prostate cancer Maternal Grandfather    Cancer Maternal Grandfather        PROSTATE    Social History Social History   Tobacco Use   Smoking status: Never   Smokeless tobacco: Never  Vaping Use   Vaping Use: Never used  Substance Use Topics   Alcohol use: No   Drug use: No     Allergies   Patient has no known allergies.   Review of Systems Review of Systems See HPI  Physical Exam Triage Vital Signs ED Triage Vitals  Enc Vitals Group     BP 05/23/21 0818 115/76     Pulse Rate 05/23/21 0818 90  Resp 05/23/21 0818 14     Temp 05/23/21 0818 99.1 F (37.3 C)     Temp Source 05/23/21 0818 Oral     SpO2 05/23/21 0818 99 %     Weight 05/23/21 0819 230 lb (104.3 kg)     Height 05/23/21 0819 5\' 8"  (1.727 m)     Head Circumference --      Peak Flow --      Pain Score 05/23/21 0823 0     Pain Loc --      Pain Edu? --      Excl. in GC? --    No data found.  Updated Vital Signs BP 115/76 (BP Location: Right Arm)   Pulse 90   Temp 99.1 F (37.3 C) (Oral)   Resp 14   Ht 5\' 8"  (1.727 m)   Wt 104.3 kg   LMP 01/11/2021   SpO2 99%   BMI 34.97 kg/m       Physical Exam Constitutional:      General: She is not in acute distress.    Appearance: She is well-developed.     Comments: Patient is in no distress.  Pleasant.  Mask is in place  HENT:     Head: Normocephalic and atraumatic.  Eyes:     Conjunctiva/sclera: Conjunctivae normal.     Pupils: Pupils are equal, round, and reactive to light.  Cardiovascular:     Rate and Rhythm: Normal rate.  Pulmonary:     Effort: Pulmonary effort is  normal. No respiratory distress.  Abdominal:     General: There is no distension.     Palpations: Abdomen is soft.  Musculoskeletal:        General: Normal range of motion.     Cervical back: Normal range of motion.  Skin:    General: Skin is warm and dry.  Neurological:     Mental Status: She is alert.  Psychiatric:        Mood and Affect: Mood normal.        Behavior: Behavior normal.     UC Treatments / Results  Labs (all labs ordered are listed, but only abnormal results are displayed) Labs Reviewed  POCT URINALYSIS DIP (MANUAL ENTRY) - Abnormal; Notable for the following components:      Result Value   Ketones, POC UA trace (5) (*)    Blood, UA trace-intact (*)    Urobilinogen, UA 2.0 (*)    Leukocytes, UA Trace (*)    All other components within normal limits  URINE CULTURE  CERVICOVAGINAL ANCILLARY ONLY    EKG   Radiology No results found.  Procedures Procedures (including critical care time)  Medications Ordered in UC Medications - No data to display  Initial Impression / Assessment and Plan / UC Course  I have reviewed the triage vital signs and the nursing notes.  Pertinent labs & imaging results that were available during my care of the patient were reviewed by me and considered in my medical decision making (see chart for details).     Discussed with patient that we would do testing and only treat if infection is identified.  She knows to check MyChart for test results.  She declines need for STD testing.  We will do urine culture, BV and Candida swab Final Clinical Impressions(s) / UC Diagnoses   Final diagnoses:  Dysuria  Vaginal discomfort  [redacted] weeks gestation of pregnancy     Discharge Instructions      Check My  Chart for your test results You will be called if any treatment is required   ED Prescriptions   None    PDMP not reviewed this encounter.   Eustace Moore, MD 05/23/21 [phone]

## 2021-05-23 NOTE — Discharge Instructions (Addendum)
Check My Chart for your test results You will be called if any treatment is required

## 2021-05-23 NOTE — ED Triage Notes (Signed)
Patient is [redacted] weeks gestation. She was initially treated for BV 03/16/21 at her first OB appointment. She reports a lot of vomiting in the first trimester so she is unsure if she kept much antibiotic in. She currently reports polyuria and vaginal irritation and a clear/white discharge. She would like to be screened for UTI and BV.

## 2021-05-24 LAB — CERVICOVAGINAL ANCILLARY ONLY
Bacterial Vaginitis (gardnerella): NEGATIVE
Candida Glabrata: NEGATIVE
Candida Vaginitis: POSITIVE — AB
Comment: NEGATIVE
Comment: NEGATIVE
Comment: NEGATIVE

## 2021-05-24 LAB — URINE CULTURE
MICRO NUMBER:: 12303443
Result:: NO GROWTH
SPECIMEN QUALITY:: ADEQUATE

## 2021-05-25 ENCOUNTER — Telehealth (HOSPITAL_COMMUNITY): Payer: Self-pay | Admitting: Emergency Medicine

## 2021-05-25 MED ORDER — TERCONAZOLE 0.4 % VA CREA: 1.0000 | TOPICAL_CREAM | Freq: Every day | VAGINAL | 0 refills | Status: AC

## 2021-08-17 ENCOUNTER — Encounter: Payer: Managed Care, Other (non HMO) | Attending: Obstetrics and Gynecology | Admitting: Registered"

## 2021-08-17 ENCOUNTER — Encounter: Payer: Self-pay | Admitting: Registered"

## 2021-08-17 ENCOUNTER — Other Ambulatory Visit: Payer: Self-pay

## 2021-08-17 DIAGNOSIS — O24419 Gestational diabetes mellitus in pregnancy, unspecified control: Secondary | ICD-10-CM | POA: Diagnosis present

## 2021-08-17 NOTE — Progress Notes (Signed)
Patient was seen on 08/17/21 for Gestational Diabetes self-management class at the [facility]. The following learning objectives were met by the patient during this course:  States the definition of Gestational Diabetes States why dietary management is important in controlling blood glucose Describes the effects each nutrient has on blood glucose levels Demonstrates ability to create a balanced meal plan Demonstrates carbohydrate counting  States when to check blood glucose levels Demonstrates proper blood glucose monitoring techniques States the effect of stress and exercise on blood glucose levels States the importance of limiting caffeine and abstaining from alcohol and smoking  Blood glucose monitor given: Accu-chek Guide Me Lot #[id number] Exp: 11/04/2022 CBG: 119 mg/dL  Patient instructed to monitor glucose levels: FBS: 60 - <95; 1 hour: <140; 2 hour: <120  Patient received handouts: Nutrition Diabetes and Pregnancy, including carb counting list  Patient will be seen for follow-up as needed.

## 2021-09-23 LAB — OB RESULTS CONSOLE GBS: GBS: NEGATIVE

## 2021-09-25 NOTE — L&D Delivery Note (Signed)
Delivery Note Labor onset:   10/18/21 Labor Onset Time: 1552 Complete dilation at  10/19/21 @ 0038 Onset of pushing at 0210 FHR second stage Cat 1 Analgesia/Anesthesia intrapartum: Epidural  Guided pushing with maternal urge. Delivery of a viable female at 64. Fetal head delivered in LOA position. Shoulder dystocia resolved with McRoberts and suprapubic pressure from maternal right. Infant with poor tone, grimace, and respiratory effort handed to RN.  Nuchal cord: none  Infant placed on maternal abd, dried, and tactile stim.  Cord double clamped immediately and cut by Father, Thayer Ohm.  RN x4 present for birth.  Cord blood sample collected: Yes Arterial cord blood sample collected: Yes, but did not arrive in lab to be resulted  Placenta delivered Tomasa Blase, intact, with 3 VC.  Placenta to L&D. Uterine tone firm, bleeding small  No laceration identified.  Anesthesia: N/A Repair: N/A EBL (mL): 150 Complications: shoulder dystocia APGAR: APGAR (1 MIN): 6   APGAR (5 MINS): 7   APGAR (10 MINS): 9   Mom to postpartum.  Baby to Couplet care / Skin to Skin   Roma Schanz MSN, CNM 10/19/2021, 4:12 AM

## 2021-09-29 LAB — OB RESULTS CONSOLE GC/CHLAMYDIA
Chlamydia: NEGATIVE
Gonorrhea: NEGATIVE

## 2021-10-10 ENCOUNTER — Other Ambulatory Visit: Payer: Self-pay | Admitting: Obstetrics & Gynecology

## 2021-10-10 ENCOUNTER — Telehealth (HOSPITAL_COMMUNITY): Payer: Self-pay | Admitting: *Deleted

## 2021-10-10 ENCOUNTER — Encounter (HOSPITAL_COMMUNITY): Payer: Self-pay | Admitting: *Deleted

## 2021-10-10 NOTE — Telephone Encounter (Signed)
Preadmission screen  

## 2021-10-17 ENCOUNTER — Encounter (HOSPITAL_COMMUNITY): Payer: Self-pay | Admitting: Obstetrics & Gynecology

## 2021-10-18 ENCOUNTER — Other Ambulatory Visit: Payer: Self-pay

## 2021-10-18 ENCOUNTER — Inpatient Hospital Stay (HOSPITAL_COMMUNITY): Payer: Managed Care, Other (non HMO) | Admitting: Anesthesiology

## 2021-10-18 ENCOUNTER — Encounter (HOSPITAL_COMMUNITY): Payer: Self-pay | Admitting: Obstetrics & Gynecology

## 2021-10-18 ENCOUNTER — Inpatient Hospital Stay (HOSPITAL_COMMUNITY)
Admission: AD | Admit: 2021-10-18 | Discharge: 2021-10-20 | DRG: 807 | Disposition: A | Discharge status: Home / Self Care | Payer: Managed Care, Other (non HMO) | Attending: Obstetrics & Gynecology | Admitting: Obstetrics & Gynecology

## 2021-10-18 ENCOUNTER — Inpatient Hospital Stay (HOSPITAL_COMMUNITY): Payer: Managed Care, Other (non HMO)

## 2021-10-18 DIAGNOSIS — Z3490 Encounter for supervision of normal pregnancy, unspecified, unspecified trimester: Secondary | ICD-10-CM | POA: Diagnosis present

## 2021-10-18 DIAGNOSIS — Z20822 Contact with and (suspected) exposure to covid-19: Secondary | ICD-10-CM | POA: Diagnosis present

## 2021-10-18 DIAGNOSIS — O24420 Gestational diabetes mellitus in childbirth, diet controlled: Secondary | ICD-10-CM | POA: Diagnosis present

## 2021-10-18 DIAGNOSIS — Z3A40 40 weeks gestation of pregnancy: Secondary | ICD-10-CM | POA: Diagnosis not present

## 2021-10-18 HISTORY — DX: Female infertility, unspecified: N97.9

## 2021-10-18 LAB — CBC
HCT: 32.5 % — ABNORMAL LOW (ref 36.0–46.0)
Hemoglobin: 10.5 g/dL — ABNORMAL LOW (ref 12.0–15.0)
MCH: 29.6 pg (ref 26.0–34.0)
MCHC: 32.3 g/dL (ref 30.0–36.0)
MCV: 91.5 fL (ref 80.0–100.0)
Platelets: 287 10*3/uL (ref 150–400)
RBC: 3.55 MIL/uL — ABNORMAL LOW (ref 3.87–5.11)
RDW: 13.4 % (ref 11.5–15.5)
WBC: 8.2 10*3/uL (ref 4.0–10.5)
nRBC: 0 % (ref 0.0–0.2)

## 2021-10-18 LAB — GLUCOSE, CAPILLARY
Glucose-Capillary: 95 mg/dL (ref 70–99)
Glucose-Capillary: 136 mg/dL — ABNORMAL HIGH (ref 70–99)
Glucose-Capillary: 88 mg/dL (ref 70–99)
Glucose-Capillary: 97 mg/dL (ref 70–99)
Glucose-Capillary: 37 mg/dL — CL (ref 70–99)
Glucose-Capillary: 54 mg/dL — ABNORMAL LOW (ref 70–99)
Glucose-Capillary: 88 mg/dL (ref 70–99)
Glucose-Capillary: 53 mg/dL — ABNORMAL LOW (ref 70–99)
Glucose-Capillary: 84 mg/dL (ref 70–99)

## 2021-10-18 LAB — RPR: RPR Ser Ql: NONREACTIVE

## 2021-10-18 LAB — RESP PANEL BY RT-PCR (FLU A&B, COVID) ARPGX2
Influenza A by PCR: NEGATIVE
Influenza B by PCR: NEGATIVE
SARS Coronavirus 2 by RT PCR: NEGATIVE

## 2021-10-18 LAB — TYPE AND SCREEN
ABO/RH(D): O POS
Antibody Screen: NEGATIVE

## 2021-10-18 MED ORDER — MISOPROSTOL 25 MCG QUARTER TABLET
25.0000 ug | ORAL_TABLET | ORAL | Status: DC | PRN
  Administered 2021-10-18 (×2): 25 ug via VAGINAL
  Filled 2021-10-18 (×2): qty 1

## 2021-10-18 MED ORDER — EPHEDRINE 5 MG/ML INJ: 10.0000 mg | INTRAVENOUS | Status: DC | PRN

## 2021-10-18 MED ORDER — OXYTOCIN-SODIUM CHLORIDE 30-0.9 UT/500ML-% IV SOLN
1.0000 m[IU]/min | INTRAVENOUS | Status: DC
  Administered 2021-10-18: 10:00:00 2 m[IU]/min via INTRAVENOUS
  Filled 2021-10-18: qty 500

## 2021-10-18 MED ORDER — LIDOCAINE HCL (PF) 1 % IJ SOLN: 30.0000 mL | INTRAMUSCULAR | Status: DC | PRN

## 2021-10-18 MED ORDER — LACTATED RINGERS IV SOLN
500.0000 mL | INTRAVENOUS | Status: DC | PRN
  Administered 2021-10-18: 15:00:00 500 mL via INTRAVENOUS

## 2021-10-18 MED ORDER — OXYTOCIN-SODIUM CHLORIDE 30-0.9 UT/500ML-% IV SOLN
2.5000 [IU]/h | INTRAVENOUS | Status: DC
  Administered 2021-10-19: 04:00:00 2.5 [IU]/h via INTRAVENOUS

## 2021-10-18 MED ORDER — FENTANYL CITRATE (PF) 100 MCG/2ML IJ SOLN
100.0000 ug | INTRAMUSCULAR | Status: DC | PRN
  Administered 2021-10-18: 11:00:00 100 ug via INTRAVENOUS
  Filled 2021-10-18: qty 2

## 2021-10-18 MED ORDER — LACTATED RINGERS IV SOLN: 500.0000 mL | Freq: Once | INTRAVENOUS | Status: DC

## 2021-10-18 MED ORDER — ACETAMINOPHEN 325 MG PO TABS: 650.0000 mg | ORAL_TABLET | ORAL | Status: DC | PRN

## 2021-10-18 MED ORDER — DIPHENHYDRAMINE HCL 50 MG/ML IJ SOLN: 12.5000 mg | INTRAMUSCULAR | Status: DC | PRN

## 2021-10-18 MED ORDER — PHENYLEPHRINE 40 MCG/ML (10ML) SYRINGE FOR IV PUSH (FOR BLOOD PRESSURE SUPPORT): 80.0000 ug | PREFILLED_SYRINGE | INTRAVENOUS | Status: DC | PRN

## 2021-10-18 MED ORDER — SOD CITRATE-CITRIC ACID 500-334 MG/5ML PO SOLN: 30.0000 mL | ORAL | Status: DC | PRN

## 2021-10-18 MED ORDER — FENTANYL-BUPIVACAINE-NACL 0.5-0.125-0.9 MG/250ML-% EP SOLN
12.0000 mL/h | EPIDURAL | Status: DC | PRN
  Filled 2021-10-18: qty 250

## 2021-10-18 MED ORDER — OXYCODONE-ACETAMINOPHEN 5-325 MG PO TABS: 1.0000 | ORAL_TABLET | ORAL | Status: DC | PRN

## 2021-10-18 MED ORDER — FENTANYL-BUPIVACAINE-NACL 0.5-0.125-0.9 MG/250ML-% EP SOLN
EPIDURAL | Status: DC | PRN
  Administered 2021-10-18: 12 mL/h via EPIDURAL

## 2021-10-18 MED ORDER — ONDANSETRON HCL 4 MG/2ML IJ SOLN
4.0000 mg | Freq: Four times a day (QID) | INTRAMUSCULAR | Status: DC | PRN
  Administered 2021-10-18: 20:00:00 4 mg via INTRAVENOUS
  Filled 2021-10-18: qty 2

## 2021-10-18 MED ORDER — OXYCODONE-ACETAMINOPHEN 5-325 MG PO TABS: 2.0000 | ORAL_TABLET | ORAL | Status: DC | PRN

## 2021-10-18 MED ORDER — TERBUTALINE SULFATE 1 MG/ML IJ SOLN: 0.2500 mg | Freq: Once | INTRAMUSCULAR | Status: DC | PRN

## 2021-10-18 MED ORDER — LIDOCAINE HCL (PF) 1 % IJ SOLN
INTRAMUSCULAR | Status: DC | PRN
  Administered 2021-10-18: 2 mL via EPIDURAL
  Administered 2021-10-18: 10 mL via EPIDURAL

## 2021-10-18 MED ORDER — OXYTOCIN BOLUS FROM INFUSION
333.0000 mL | Freq: Once | INTRAVENOUS | Status: AC
  Administered 2021-10-19: 04:00:00 333 mL via INTRAVENOUS

## 2021-10-18 MED ORDER — LACTATED RINGERS IV SOLN: INTRAVENOUS | Status: DC

## 2021-10-18 NOTE — Anesthesia Procedure Notes (Signed)
Epidural Patient location during procedure: OB Start time: 10/18/2021 12:52 PM End time: 10/18/2021 1:02 PM  Staffing Anesthesiologist: Lannie Fields, DO Performed: anesthesiologist   Preanesthetic Checklist Completed: patient identified, IV checked, risks and benefits discussed, monitors and equipment checked, pre-op evaluation and timeout performed  Epidural Patient position: sitting Prep: DuraPrep and site prepped and draped Patient monitoring: continuous pulse ox, blood pressure, heart rate and cardiac monitor Approach: midline Location: L3-L4 Injection technique: LOR air  Needle:  Needle type: Tuohy  Needle gauge: 17 G Needle length: 9 cm Needle insertion depth: 8 cm Catheter type: closed end flexible Catheter size: 19 Gauge Catheter at skin depth: 14 cm Test dose: negative  Assessment Sensory level: T8 Events: blood not aspirated, injection not painful, no injection resistance, no paresthesia and negative IV test  Additional Notes Patient identified. Risks/Benefits/Options discussed with patient including but not limited to bleeding, infection, nerve damage, paralysis, failed block, incomplete pain control, headache, blood pressure changes, nausea, vomiting, reactions to medication both or allergic, itching and postpartum back pain. Confirmed with bedside nurse the patient's most recent platelet count. Confirmed with patient that they are not currently taking any anticoagulation, have any bleeding history or any family history of bleeding disorders. Patient expressed understanding and wished to proceed. All questions were answered. Sterile technique was used throughout the entire procedure. Please see nursing notes for vital signs. Test dose was given through epidural catheter and negative prior to continuing to dose epidural or start infusion. Warning signs of high block given to the patient including shortness of breath, tingling/numbness in hands, complete motor  block, or any concerning symptoms with instructions to call for help. Patient was given instructions on fall risk and not to get out of bed. All questions and concerns addressed with instructions to call with any issues or inadequate analgesia.  Reason for block:procedure for pain

## 2021-10-18 NOTE — Progress Notes (Signed)
MD LABOR PROGRESS NOTE  Alyssa Sparks is a 31 y.o. G1P0 at [redacted]w[redacted]d admitted for induction of labor due to Gestational diabetes.  Subjective: Patient starting to feel more painful contractions.  Objective: BP 116/86    Pulse 91    Temp 98.2 F (36.8 C) (Oral)    Resp 16    Ht 5\' 7"  (1.702 m)    Wt 112.4 kg    LMP 01/11/2021    BMI 38.80 kg/m  No intake/output data recorded.  FHT:  FHR: 145 bpm, variability: moderate,  accelerations:  Present,  decelerations:  Absent UC:   irregular, every 3-6 minutes SVE:   Dilation: 2 Effacement (%): 50 Station: -2 Exam by:: Atira Borello, MD AROM-clear  Labs: Lab Results  Component Value Date   WBC 8.2 10/18/2021   HGB 10.5 (L) 10/18/2021   HCT 32.5 (L) 10/18/2021   MCV 91.5 10/18/2021   PLT 287 10/18/2021    Assessment / Plan: Induction of labor due to gestational diabetes,  s/p AROM will  Start high dose pitocin  Labor:  latent phase Fetal Wellbeing:  Category I Pain Control:  IV pain meds I/D:  n/a Anticipated MOD:  NSVD  10/20/2021 MD 10/18/2021, 11:40 AM

## 2021-10-18 NOTE — H&P (Signed)
OB ADMISSION/ HISTORY & PHYSICAL:  Admission Date: 10/18/2021 12:00 AM  Admit Diagnosis: IOL   Alyssa Sparks is a 31 y.o. female G1P0 [redacted]w[redacted]d presenting for IOL for GDMA1. Endorses active FM, denies LOF and vaginal bleeding.   History of current pregnancy: G1P0   Primary OB Provider: [facility] Patient entered care with [facility] at 12.2 wks.   EDC 10/18/21 by LMP04/19/22 and congruent w/ 8 wk U/S.   Anatomy scan:  20.2 wks, complete w/ posterior placenta.   Antenatal testing: for GDM, BMI started at 36 weeks  Significant prenatal events:  Vit D deficiency Anemia of pregnancy SGA resolved Patient Active Problem List   Diagnosis Date Noted   Encounter for induction of labor 10/18/2021   Gestational diabetes mellitus (GDM), antepartum 08/17/2021   Acne 11/20/2019   Acute stress reaction 11/01/2019   Irregular periods 06/23/2019   Hirsutism 06/23/2019   Obesity with body mass index 30 or greater 06/23/2019   Blurred vision, right eye 10/02/2016   Pendulous breast 06/02/2016   Macromastia 06/02/2016   Chronic bilateral low back pain without sciatica 06/02/2016   PCOS (polycystic ovarian syndrome) 10/14/2015   Menometrorrhagia 01/24/2012    Prenatal Labs: ABO, Rh: O/Positive/-- (06/22 0000) Antibody: Negative (06/22 0000) Rubella: Immune (06/22 0000)  RPR: Nonreactive (06/22 0000)  HBsAg: Negative (06/22 0000)  HIV: Non-reactive (06/22 0000)  GTT: 150 GBS: Negative/-- (12/30 0000)   GC/CHL: Negative/ Negative Genetics: Panorama low risk, Horizon negative Tdap/influenza vaccines:    OB History  Gravida Para Term Preterm AB Living  1 0       0  SAB IAB Ectopic Multiple Live Births               # Outcome Date GA Lbr Len/2nd Weight Sex Delivery Anes PTL Lv  1 Current             Medical / Surgical History: Past medical history:  Past Medical History:  Diagnosis Date   Broken arm    RIGHT   Gestational diabetes    Infertility, female    Polycystic ovarian disease      Past surgical history: No past surgical history on file. Family History:  Family History  Problem Relation Age of Onset   Breast cancer Other    Hypertension Mother    Diabetes Mother    Heart attack Father    Diabetes Maternal Grandmother    Hypertension Maternal Grandmother    Deep vein thrombosis Maternal Grandmother    Diabetes Maternal Grandfather    Hypertension Maternal Grandfather    Prostate cancer Maternal Grandfather    Cancer Maternal Grandfather        PROSTATE    Social History:  reports that she has never smoked. She has never used smokeless tobacco. She reports that she does not drink alcohol and does not use drugs.  Allergies: Patient has no known allergies.   Current Medications at time of admission:  Prior to Admission medications   Medication Sig Start Date End Date Taking? Authorizing Provider  ondansetron (ZOFRAN ODT) 4 MG disintegrating tablet Take 1 tablet (4 mg total) by mouth every 8 (eight) hours as needed for nausea or vomiting. 12/08/20   Alyssa Games, MD  prenatal vitamin w/FE, FA (PRENATAL 1 + 1) 27-1 MG TABS tablet Take 1 tablet by mouth daily at 12 noon.    [provider]    Review of Systems: Constitutional: Negative   HENT: Negative   Eyes: Negative   Respiratory:  Negative   Cardiovascular: Negative   Gastrointestinal: Negative  Genitourinary: negative for bloody show, negative for LOF   Musculoskeletal: Negative   Skin: Negative   Neurological: Negative   Endo/Heme/Allergies: Negative   Psychiatric/Behavioral: Negative    Physical Exam: VS: Height 5\' 7"  (1.702 m), weight 112.4 kg, last menstrual period 01/11/2021. AAO x3, no signs of distress Cardiovascular: RRR Respiratory: Lung fields clear to ausculation GU/GI: Abdomen gravid, non-tender, non-distended, active FM, vertex, EFW 7 per Leopold's Extremities: trace edema, negative for pain, tenderness, and cords  Cervical exam:Dilation: 1.5 Effacement (%):  60 Station: -2 Exam by:: [email or web address], CNM FHR: baseline rate 155 / variability moderate / accelerations present / absent decelerations TOCO: occasional   Prenatal Transfer Tool  Maternal Diabetes: Yes:  Diabetes Type:  Diet controlled Genetic Screening: Normal Maternal Ultrasounds/Referrals: Normal Fetal Ultrasounds or other Referrals:  None Maternal Substance Abuse:  No Significant Maternal Medications:  None Significant Maternal Lab Results: Group B Strep negative    Assessment: 31 y.o. G1P0 [redacted]w[redacted]d IOL @ term for GDMA1  Induction of labor FHR category 1 GBS negative Pain management plan: epidural prn Cytotec [redacted]w[redacted]d per vagina placed  Plan:  Admit to L&D Routine admission orders Epidural PRN Cytotec redose in 4 hours prn  Dr notified of admission and plan of care  Alyssa Ober Kenidee Cregan MSN, CNM 10/18/2021 1:14 AM

## 2021-10-18 NOTE — Progress Notes (Signed)
Subjective:    Comfortable w/epidural.   Objective:    VS: BP 118/77    Pulse 79    Temp 97.7 F (36.5 C) (Axillary)    Resp 17    Ht 5\' 7"  (1.702 m)    Wt 112.4 kg    LMP 01/11/2021    SpO2 97%    BMI 38.80 kg/m  FHR: baseline 140 / variability moderate / accelerations present / absent decelerations Toco: contractions every 2 minutes / MVU 210 Membranes: AROM x9.5 hrs Dilation: 5 Effacement (%): 90 Station: -1 Presentation: Vertex Exam by:: Pinn, MD Pitocin 4 mU/min CBG (last 3)  Recent Labs    10/18/21 1727 10/18/21 1751 10/18/21 2012  GLUCAP 54* 88 53*     Assessment/Plan:   31 y.o. G1P0 [redacted]w[redacted]d IOL for A1GDM  Labor: Progressing normally on Pitocin Fetal Wellbeing:  Category I Pain Control:  Epidural I/D:   GBS neg Anticipated MOD:  NSVD  [redacted]w[redacted]d MSN, CNM 10/18/2021 7:29 PM

## 2021-10-18 NOTE — Progress Notes (Signed)
Alyssa Sparks is a 31 y.o. G1P0 at [redacted]w[redacted]d admitted for induction of labor due to Gestational diabetes.  Subjective:  Pt resting comfortably without complaints. Objective: BP 136/80    Pulse 84    Temp 97.9 F (36.6 C) (Oral)    Resp 16    Ht 5\' 7"  (1.702 m)    Wt 112.4 kg    LMP 01/11/2021    BMI 38.80 kg/m  No intake/output data recorded. No intake/output data recorded.  FHT:  FHR: 155 bpm, variability: moderate,  accelerations:  Present,  decelerations:  Present occasional variable UC:   irregular, every 1-4 minutes SVE:   Dilation: 1.5 Effacement (%): 50 Station: -2 Exam by:: [email or web address], RN  Labs: Lab Results  Component Value Date   WBC 8.2 10/18/2021   HGB 10.5 (L) 10/18/2021   HCT 32.5 (L) 10/18/2021   MCV 91.5 10/18/2021   PLT 287 10/18/2021    Assessment / Plan: Induction of labor due to gestational diabetes,  progressing well on pitocin  Labor:  cytotec x 2  Fetal Wellbeing:  Category I Pain Control:  Epidural prn I/D:   GBS negative Anticipated MOD:  NSVD Dr 10/20/2021 aware of pt status and POC  Sallye Ober MSN, CNM 10/18/2021, 6:38 AM

## 2021-10-18 NOTE — Anesthesia Preprocedure Evaluation (Signed)
Anesthesia Evaluation  Patient identified by MRN, date of birth, ID band Patient awake    Reviewed: Allergy & Precautions, Patient's Chart, lab work & pertinent test results  Airway Mallampati: II  TM Distance: >3 FB Neck ROM: Full    Dental no notable dental hx.    Pulmonary neg pulmonary ROS,    Pulmonary exam normal breath sounds clear to auscultation       Cardiovascular negative cardio ROS Normal cardiovascular exam Rhythm:Regular Rate:Normal     Neuro/Psych PSYCHIATRIC DISORDERS Anxiety negative neurological ROS     GI/Hepatic negative GI ROS, Neg liver ROS,   Endo/Other  diabetes, Well Controlled, GestationalBMI 39  Renal/GU negative Renal ROS  Female GU complaint     Musculoskeletal negative musculoskeletal ROS (+)   Abdominal (+) + obese,   Peds negative pediatric ROS (+)  Hematology  (+) Blood dyscrasia, anemia , hb 10.5, plt 287   Anesthesia Other Findings   Reproductive/Obstetrics (+) Pregnancy                             Anesthesia Physical Anesthesia Plan  ASA: 3  Anesthesia Plan: Epidural   Post-op Pain Management:    Induction:   PONV Risk Score and Plan: 2  Airway Management Planned: Natural Airway  Additional Equipment: None  Intra-op Plan:   Post-operative Plan:   Informed Consent: I have reviewed the patients History and Physical, chart, labs and discussed the procedure including the risks, benefits and alternatives for the proposed anesthesia with the patient or authorized representative who has indicated his/her understanding and acceptance.       Plan Discussed with:   Anesthesia Plan Comments:         Anesthesia Quick Evaluation

## 2021-10-18 NOTE — Progress Notes (Signed)
MD LABOR PROGRESS NOTE  Alyssa Sparks is a 31 y.o. G1P0 at [redacted]w[redacted]d admitted for induction of labor due to Gestational diabetes.  Subjective: Patient comfortable now with epidural  Objective: BP 115/61    Pulse 63    Temp 97.7 F (36.5 C) (Oral)    Resp 17    Ht 5\' 7"  (1.702 m)    Wt 112.4 kg    LMP 01/11/2021    SpO2 97%    BMI 38.80 kg/m  No intake/output data recorded.  FHT:  FHR: 140 bpm, variability: moderate,  accelerations:  Present,  decelerations:  Absent UC:   unable to trace SVE:   Dilation: 5 Effacement (%): 90 Station: -1 Exam by:: Natilie Krabbenhoft, MD IUPC placed  Labs: Lab Results  Component Value Date   WBC 8.2 10/18/2021   HGB 10.5 (L) 10/18/2021   HCT 32.5 (L) 10/18/2021   MCV 91.5 10/18/2021   PLT 287 10/18/2021    Assessment / Plan: Induction of labor due to gestational diabetes,  progressing well on pitocin  Labor:  transitioning into active labor Fetal Wellbeing:  Category I Pain Control:  Epidural I/D:  n/a Anticipated MOD:  NSVD  10/20/2021 MD 10/18/2021, 4:21 PM

## 2021-10-18 NOTE — Progress Notes (Signed)
Hypoglycemic Event  CBG: 54  Treatment: 8 oz juice/soda  Symptoms: Shaky  Follow-up CBG: Time:1752 CBG Result:88  Possible Reasons for Event: clear liquid diet  Comments/MD notified:Pinn, MD notified    Alyssa Sparks

## 2021-10-19 ENCOUNTER — Encounter (HOSPITAL_COMMUNITY): Payer: Self-pay | Admitting: Obstetrics & Gynecology

## 2021-10-19 LAB — GLUCOSE, CAPILLARY
Glucose-Capillary: 114 mg/dL — ABNORMAL HIGH (ref 70–99)
Glucose-Capillary: 67 mg/dL — ABNORMAL LOW (ref 70–99)
Glucose-Capillary: 83 mg/dL (ref 70–99)

## 2021-10-19 LAB — CBC
HCT: 31.6 % — ABNORMAL LOW (ref 36.0–46.0)
Hemoglobin: 10.2 g/dL — ABNORMAL LOW (ref 12.0–15.0)
MCH: 29.7 pg (ref 26.0–34.0)
MCHC: 32.3 g/dL (ref 30.0–36.0)
MCV: 91.9 fL (ref 80.0–100.0)
Platelets: 247 10*3/uL (ref 150–400)
RBC: 3.44 MIL/uL — ABNORMAL LOW (ref 3.87–5.11)
RDW: 13.5 % (ref 11.5–15.5)
WBC: 15.5 10*3/uL — ABNORMAL HIGH (ref 4.0–10.5)
nRBC: 0 % (ref 0.0–0.2)

## 2021-10-19 MED ORDER — BENZOCAINE-MENTHOL 20-0.5 % EX AERO: 1.0000 "application " | INHALATION_SPRAY | CUTANEOUS | Status: DC | PRN

## 2021-10-19 MED ORDER — TETANUS-DIPHTH-ACELL PERTUSSIS 5-2.5-18.5 LF-MCG/0.5 IM SUSY: 0.5000 mL | PREFILLED_SYRINGE | Freq: Once | INTRAMUSCULAR | Status: DC

## 2021-10-19 MED ORDER — PRENATAL MULTIVITAMIN CH
1.0000 | ORAL_TABLET | Freq: Every day | ORAL | Status: DC
  Administered 2021-10-19 – 2021-10-20 (×2): 1 via ORAL
  Filled 2021-10-19 (×2): qty 1

## 2021-10-19 MED ORDER — SIMETHICONE 80 MG PO CHEW: 80.0000 mg | CHEWABLE_TABLET | ORAL | Status: DC | PRN

## 2021-10-19 MED ORDER — ACETAMINOPHEN 325 MG PO TABS: 650.0000 mg | ORAL_TABLET | ORAL | Status: DC | PRN

## 2021-10-19 MED ORDER — DIBUCAINE (PERIANAL) 1 % EX OINT: 1.0000 "application " | TOPICAL_OINTMENT | CUTANEOUS | Status: DC | PRN

## 2021-10-19 MED ORDER — SENNOSIDES-DOCUSATE SODIUM 8.6-50 MG PO TABS
2.0000 | ORAL_TABLET | ORAL | Status: DC
  Administered 2021-10-19: 11:00:00 2 via ORAL
  Filled 2021-10-19: qty 2

## 2021-10-19 MED ORDER — DIPHENHYDRAMINE HCL 25 MG PO CAPS: 25.0000 mg | ORAL_CAPSULE | Freq: Four times a day (QID) | ORAL | Status: DC | PRN

## 2021-10-19 MED ORDER — WITCH HAZEL-GLYCERIN EX PADS: 1.0000 "application " | MEDICATED_PAD | CUTANEOUS | Status: DC | PRN

## 2021-10-19 MED ORDER — ONDANSETRON HCL 4 MG PO TABS: 4.0000 mg | ORAL_TABLET | ORAL | Status: DC | PRN

## 2021-10-19 MED ORDER — ONDANSETRON HCL 4 MG/2ML IJ SOLN: 4.0000 mg | INTRAMUSCULAR | Status: DC | PRN

## 2021-10-19 MED ORDER — COCONUT OIL OIL: 1.0000 "application " | TOPICAL_OIL | Status: DC | PRN

## 2021-10-19 MED ORDER — IBUPROFEN 600 MG PO TABS
600.0000 mg | ORAL_TABLET | Freq: Four times a day (QID) | ORAL | Status: DC
  Administered 2021-10-19 – 2021-10-20 (×5): 600 mg via ORAL
  Filled 2021-10-19 (×5): qty 1

## 2021-10-19 NOTE — Lactation Note (Signed)
This note was copied from a baby's chart. Lactation Consultation Note Baby cueing to feed. LC placed pillows at mom's side for latching. Mom has pitting edema to areola. LC used finger massage to ever and soften breast tissue for latching. W/t-cup holding nipple in baby's mouth, baby would suckle on nipple. Unable to maintain latch w/o LC holding nipple in mouth. Mom stated that she would prefer to pump and bottle feed. Mom chooses to give formula until her milk comes in. Baby hungry, mom asking for formula. RN will bring mom a bottle. Discussed pumping w/mom. Mom in agreement. Mom will be f/u on MBU.  Patient Name: Girl Kerstie Agent [id number] Date: 10/19/2021 Reason for consult: L&D Initial assessment;Primapara;Term;Maternal endocrine disorder Age:62 hours  Maternal Data    Feeding Mother's Current Feeding Choice: Breast Milk and Formula  LATCH Score Latch: Grasps breast easily, tongue down, lips flanged, rhythmical sucking.  Audible Swallowing: None  Type of Nipple: Flat  Comfort (Breast/Nipple): Filling, red/small blisters or bruises, mild/mod discomfort (pitting edema)  Hold (Positioning): Full assist, staff holds infant at breast  LATCH Score: 4   Lactation Tools Discussed/Used    Interventions    Discharge    Consult Status Consult Status: Follow-up from L&D Date: 10/19/21 Follow-up type: In-patient    Charyl Dancer 10/19/2021, 4:40 AM

## 2021-10-19 NOTE — Progress Notes (Signed)
Subjective:    Doing well, reports rectal pressure w/ each contraction.   Objective:    VS: BP 127/80    Pulse 74    Temp 98.3 F (36.8 C) (Oral)    Resp 16    Ht 5\' 7"  (1.702 m)    Wt 112.4 kg    LMP 01/11/2021    SpO2 97%    BMI 38.80 kg/m  FHR : baseline 135 / variability moderate / accelerations present / absent decelerations Toco: contractions every 2-3 minutes / MVU 220 Membranes: AROM x14 hrs, remains clear Dilation: 9 Effacement (%): 100 Station: Plus 2 Presentation: Vertex Exam by:: [email or web address], CNM Pitocin 6 mU/min  Assessment/Plan:   31 y.o. G1P0 [redacted]w[redacted]d IOL for A1GDM   Labor: Progressing normally on Pitocin Fetal Wellbeing:  Category I Pain Control:  Epidural I/D:   GBS neg Anticipated MOD:  NSVD  [redacted]w[redacted]d MSN, CNM 10/19/2021 12:19 AM

## 2021-10-19 NOTE — Lactation Note (Signed)
This note was copied from a baby's chart. Lactation Consultation Note  Patient Name: Alyssa Sparks [id number] Date: 10/19/2021 Reason for consult: Initial assessment;Primapara;1st time breastfeeding;Term;Maternal endocrine disorder Age:30 hours  LC in to visit with P1 Mom of term baby.  Mom is choosing to formula feed baby by bottle and pump until her milk comes to volume.  Mom states she latches baby first before giving formula.   RN set up DEBP and Mom has pumped once.  Reassured her that is was normal to not see any colostrum, but she was stimulating her milk producing hormones when double pumping.   Mom resting in bed.  Not a lot of teaching done presently.   Mom has washing and drying bins set up.  Mom to ask for help prn.  Mom has 2 DEBP at home. Denies questions.  Maternal Data Has patient been taught Hand Expression?: Yes Does the patient have breastfeeding experience prior to this delivery?: No  Feeding Mother's Current Feeding Choice: Breast Milk and Formula Nipple Type: Slow - flow   Lactation Tools Discussed/Used Tools: Pump;Bottle Breast pump type: Double-Electric Breast Pump Reason for Pumping: support milk supply/Mom choosing to supplement baby with formula by bottle Pumping frequency: Encouraged Mom to pump both breasts after breastfeeding if baby is being supplemented Pumped volume: 0 mL  Discharge Pump: Personal (Mom has 2 DEBP at home)  Consult Status Consult Status: Follow-up Date: 10/20/21 Follow-up type: In-patient    Judee Clara 10/19/2021, 9:54 AM

## 2021-10-19 NOTE — Anesthesia Postprocedure Evaluation (Signed)
Anesthesia Post Note  Patient: Alyssa Sparks  Procedure(s) Performed: AN AD HOC LABOR EPIDURAL     Patient location during evaluation: Mother Baby Anesthesia Type: Epidural Level of consciousness: awake and alert and oriented Pain management: satisfactory to patient Vital Signs Assessment: post-procedure vital signs reviewed and stable Respiratory status: respiratory function stable Cardiovascular status: stable Postop Assessment: no headache, no backache, epidural receding, patient able to bend at knees, no signs of nausea or vomiting, adequate PO intake and able to ambulate Anesthetic complications: no   No notable events documented.  Last Vitals:  Vitals:   10/19/21 0607 10/19/21 0841  BP: 131/89 125/77  Pulse: 80 84  Resp: 18 19  Temp: 37.2 C   SpO2: 98%     Last Pain:  Vitals:   10/19/21 0607  TempSrc: Oral  PainSc: 0-No pain   Pain Goal:                   [name]

## 2021-10-19 NOTE — Progress Notes (Signed)
Chaplain responded to Neonatal Code Blue which was cancelled by the time Chaplain arrived on the unit.  Chaplain services not needed at this time.  Vernell Morgans Chaplain

## 2021-10-20 MED ORDER — FENUGREEK 500 MG PO CAPS: 1.0000 | ORAL_CAPSULE | Freq: Every day | ORAL | 3 refills | Status: DC

## 2021-10-20 MED ORDER — SENNOSIDES-DOCUSATE SODIUM 8.6-50 MG PO TABS: 2.0000 | ORAL_TABLET | Freq: Every evening | ORAL | 3 refills | Status: DC | PRN

## 2021-10-20 NOTE — Discharge Summary (Signed)
[facility] [place] Discharge Summary   Patient Name:   Alyssa Sparks DOB:     01-10-1991 MRN:     [id number]  Date of Admission:   10/18/2021 Date of Discharge:  10/20/2021  Admitting diagnosis:    Encounter for induction of labor [Z34.90] Principal Problem:   Encounter for induction of labor GDM A1    Discharge diagnosis:    Encounter for induction of labor [Z34.90] Principal Problem:   Encounter for induction of labor  Term Pregnancy Delivered        Additional problems: None                                          Post partum procedures: none Augmentation: AROM, Pitocin, and Cytotec Complications: None  Hospital course: Induction of Labor With Vaginal Delivery   31 y.o. yo G1P1001 at 18w1dwas admitted to the hospital 10/18/2021 for induction of labor.  Indication for induction: A1 DM.  Patient had an uncomplicated labor course as follows: Membrane Rupture Time/Date: 9:40 AM ,10/18/2021   Delivery Method:Vaginal, Spontaneous  Episiotomy: None  Lacerations:  None  Details of delivery can be found in separate delivery note.  Patient had a routine postpartum course. Patient is discharged home 10/20/21.  Newborn Data: Birth date:10/19/2021  Birth time:3:41 AM  Gender:Female  Living status:Living  Apgars:6 ,7  Weight:3800 g   Magnesium Sulfate received: No BMZ received: No Rhophylac:No MMR:No T-DaP:Given prenatally Flu: No Transfusion:No                                                               Type of Delivery:  NSVD Delivering Provider: GArrie Eastern Date of Delivery:  10/19/21  Newborn Data:  Baby Feeding:   Breast Disposition:   home with mother  Physical Exam:   Vitals:   10/19/21 0841 10/19/21 1119 10/19/21 1515 10/19/21 1945  BP: 125/77 115/67 115/70 126/78  Pulse: 84 68 71 76  Resp: _0 Temp: 98.2 F (36.8 C) 98.4 F (36.9 C) 97.9 F (36.6 C) 98.6 F (37 C)  TempSrc: Oral Oral Oral Oral  SpO2: 99%   99%  Weight:       Height:       General: alert, cooperative, and no distress Lochia: appropriate Uterine Fundus: firm Incision: N/A DVT Evaluation: No evidence of DVT seen on physical exam. Negative Homan's sign. No cords or calf tenderness.  Labs: Lab Results  Component Value Date   WBC 15.5 (H) 10/19/2021   HGB 10.2 (L) 10/19/2021   HCT 31.6 (L) 10/19/2021   MCV 91.9 10/19/2021   PLT 247 10/19/2021   CMP Latest Ref Rng & Units 10/29/2020  Glucose 65 - 99 mg/dL 88  BUN 7 - 25 mg/dL 10  Creatinine 0.50 - 1.10 mg/dL 0.61  Sodium 135 - 146 mmol/L 140  Potassium 3.5 - 5.3 mmol/L 4.0  Chloride 98 - 110 mmol/L 105  CO2 20 - 32 mmol/L 27  Calcium 8.6 - 10.2 mg/dL 9.7  Total Protein 6.1 - 8.1 g/dL 7.5  Total Bilirubin 0.2 - 1.2 mg/dL 0.4  AST 10 - 30 U/L 17  ALT 6 -  29 U/L 9    Discharge instruction: per After Visit Summary and "Baby and Me Booklet".  After Visit Meds:  Allergies as of 10/20/2021   No Known Allergies      Medication List     TAKE these medications    Fenugreek 500 MG Caps Take 1 capsule by mouth daily.   FeroSul 325 (65 FE) MG tablet Generic drug: ferrous sulfate Take 325 mg by mouth daily.   ondansetron 4 MG disintegrating tablet Commonly known as: Zofran ODT Take 1 tablet (4 mg total) by mouth every 8 (eight) hours as needed for nausea or vomiting.   prenatal vitamin w/FE, FA 27-1 MG Tabs tablet Take 1 tablet by mouth daily at 12 noon.   senna-docusate 8.6-50 MG tablet Commonly known as: Senokot-S Take 2 tablets by mouth at bedtime as needed for mild constipation.        Diet: routine diet  Activity: Advance as tolerated. Pelvic rest for 6 weeks.   Outpatient follow up:6 weeks Follow up Appt:No future appointments. Follow up visit: No follow-ups on file.  Postpartum contraception: Progesterone only pills  10/20/2021 Sanjuana Kava, MD

## 2021-10-21 ENCOUNTER — Telehealth: Payer: Self-pay | Admitting: General Practice

## 2021-10-21 NOTE — Telephone Encounter (Signed)
Transition Care Management Follow-up Telephone Call Date of discharge and from where: 10/20/21 How have you been since you were released from the hospital? Patient will follow up with OB. Any questions or concerns? No

## 2021-11-01 ENCOUNTER — Telehealth (HOSPITAL_COMMUNITY): Payer: Self-pay

## 2021-11-01 NOTE — Telephone Encounter (Signed)
"  Good adjusting still. Everything is going ok." Patient declines questions or concerns about her healing.  "She's ok. She has a broken clavicle. It happened during childbirth. We have been to the doctor a few times. That arm is getting stronger. She is eating well and doing good. She sleeps in a bassinet by my bed." RN reviewed ABC's of safe sleep with patient. Patient declines any questions or concerns about baby.  EPDS score is 2.   Alyssa Sparks [facility] 11/01/2021,1434

## 2021-12-13 ENCOUNTER — Other Ambulatory Visit: Payer: Self-pay

## 2021-12-13 ENCOUNTER — Ambulatory Visit (INDEPENDENT_AMBULATORY_CARE_PROVIDER_SITE_OTHER): Payer: Managed Care, Other (non HMO) | Admitting: Family Medicine

## 2021-12-13 ENCOUNTER — Encounter: Payer: Self-pay | Admitting: Family Medicine

## 2021-12-13 VITALS — BP 127/80 | HR 85 | Ht 67.0 in | Wt 219.0 lb

## 2021-12-13 DIAGNOSIS — F430 Acute stress reaction: Secondary | ICD-10-CM

## 2021-12-13 DIAGNOSIS — Z0000 Encounter for general adult medical examination without abnormal findings: Secondary | ICD-10-CM

## 2021-12-13 MED ORDER — ESCITALOPRAM OXALATE 10 MG PO TABS: 10.0000 mg | ORAL_TABLET | Freq: Every day | ORAL | 1 refills | Status: DC

## 2021-12-13 NOTE — Progress Notes (Signed)
Subjective:  ?  ? Alyssa Sparks is a 31 y.o. female .  She was originally scheduled as a physical but after initiating the conversation decided to convert to a regular office visit.  The patient reports problems - gave birth in Jan. Walking regularly. Has had aher 6 week f/u with OB-GYN. She is not breast-feeding.  She is on birth control.  She said she did feel fairly down after giving birth.  Her baby is now almost 62 weeks old.  She is just felt very conflicted with her current relationship with the baby's father.  They are living together and engaged.  But he works third shift but has not seemed very supportive and coparenting.  He also has a 1-year-old with someone else and she is having to do a lot of childcare for the 27-year-old every other week while he is working and they are conflicting on coparenting with him as well.  She has some great support from her mom who will come over and help out and give her a little time to run errands or even just a little bit of downtime.  She is actually scheduled to go back to work on April 16 and is worried that it will really increase her stress levels.  She is getting go back to work part-time, 3 days/week. ? ?Social History  ? ?Socioeconomic History  ? Marital status: Significant Other  ?  Spouse name: Thayer Ohm  ? Number of children: Not on file  ? Years of education: Not on file  ? Highest education level: Not on file  ?Occupational History  ? Occupation: CNA/MED-TECH  ?Tobacco Use  ? Smoking status: Never  ? Smokeless tobacco: Never  ? Tobacco comments:  ?  No tobacco use  ?Vaping Use  ? Vaping Use: Never used  ?Substance and Sexual Activity  ? Alcohol use: Not Currently  ?  Comment: Postpartum not drinking currently  ? Drug use: No  ? Sexual activity: Yes  ?  Birth control/protection: Condom, Pill  ?  Comment: WAS ON B/C STOP ON HER OWN  ?Other Topics Concern  ? Not on file  ?Social History Narrative  ? Not on file  ? ?Social Determinants of Health  ? ?Financial Resource  Strain: Not on file  ?Food Insecurity: No Food Insecurity  ? Worried About Programme researcher, broadcasting/film/video in the Last Year: Never true  ? Ran Out of Food in the Last Year: Never true  ?Transportation Needs: Not on file  ?Physical Activity: Not on file  ?Stress: Not on file  ?Social Connections: Not on file  ?Intimate Partner Violence: Not on file  ? ?Health Maintenance  ?Topic Date Due  ? COVID-19 Vaccine (4 - Booster for Pfizer series) 05/25/2022 (Originally 09/07/2020)  ? PAP-Cervical Cytology Screening  03/16/2024  ? PAP SMEAR-Modifier  03/16/2024  ? TETANUS/TDAP  10/28/2030  ? INFLUENZA VACCINE  Completed  ? Hepatitis C Screening  Completed  ? HIV Screening  Completed  ? Pneumococcal Vaccine 82-5 Years old  Aged Out  ? HPV VACCINES  Aged Out  ? ? ?The following portions of the patient's history were reviewed and updated as appropriate: allergies, current medications, past family history, past medical history, past social history, past surgical history, and problem list. ? ?Review of Systems ?A comprehensive review of systems was negative.  ? ?Objective:  ? ? BP 127/80   Pulse 85   Ht 5\' 7"  (1.702 m)   Wt 219 lb (99.3 kg)   LMP  12/13/2021 (Exact Date)   SpO2 96%   Breastfeeding No   BMI 34.30 kg/m?  ?General appearance: alert, cooperative, and appears stated age ?Lungs: clear to auscultation bilaterally ?Heart: regular rate and rhythm, S1, S2 normal, no murmur, click, rub or gallop  ?  ?Assessment:  ? ? Healthy female exam.    ?  ?Plan:  ? ?Stress/adjustment disorder-discussed options.  She would really like to go back on Lexapro which she took before her pregnancy.  We also discussed maybe even working with a Environmental consultant.  She has a lot of big decisions to make in regards to continuing her current relationship with her baby's father and its been particularly stressful and worrisome in addition to returning to work in a couple of weeks.  I did encourage her to just continue to work on those healthy habits  and try to get rest when she can.  Her mom is a great support for her which is fantastic.  I would like to see her back in about 6 to 8 weeks after restarting the Lexapro to make sure that she feels like it is helpful. ? ?Meds ordered this encounter  ?Medications  ? escitalopram (LEXAPRO) 10 MG tablet  ?  Sig: Take 1 tablet (10 mg total) by mouth daily.  ?  Dispense:  90 tablet  ?  Refill:  1  ? ?I spent 25 minutes on the day of the encounter to include pre-visit record review, face-to-face time with the patient and post visit ordering of test. ? ?

## 2021-12-13 NOTE — Patient Instructions (Signed)
OK to restart the Lexapro.  I recommend starting with a half a tab daily for 8 days and then go up to a whole tab ?

## 2022-01-31 ENCOUNTER — Encounter: Payer: Self-pay | Admitting: Family Medicine

## 2022-01-31 ENCOUNTER — Ambulatory Visit (INDEPENDENT_AMBULATORY_CARE_PROVIDER_SITE_OTHER): Payer: Managed Care, Other (non HMO) | Admitting: Family Medicine

## 2022-01-31 VITALS — BP 132/71 | HR 84 | Ht 67.0 in | Wt 214.0 lb

## 2022-01-31 DIAGNOSIS — F430 Acute stress reaction: Secondary | ICD-10-CM

## 2022-01-31 NOTE — Progress Notes (Signed)
? ?  Established Patient Office Visit ? ?Subjective   ?Patient ID: Alyssa Sparks, female    DOB: [birth year]  Age: 31 y.o. MRN: [id number] ? ?Chief Complaint  ?Patient presents with  ? Follow-up  ?   ?  ? ? ?HPI  ? ?She is overall doing really well back on Lexapro.  She had taken it prepregnancy.  She is still living with her baby's father but has decided that she is probably can move back in with her mom when her lease is up in August.  She feels like the medication has been helpful she has any side effects or problems.  She feels like she is emotionally been able to separate some things for herself and so that actually has been really helpful.  She is also been talking with the chaplain at work so even though she has not connected with a formal therapist she says that actually has been really helpful. ? ? ? ?ROS ? ?  ?Objective:  ?  ? ?BP 132/71   Pulse 84   Ht 5\' 7"  (1.702 m)   Wt 214 lb (97.1 kg)   SpO2 98%   BMI 33.52 kg/m?  ? ? ?Physical Exam ?Vitals and nursing note reviewed.  ?Constitutional:   ?   Appearance: She is well-developed.  ?HENT:  ?   Head: Normocephalic and atraumatic.  ?Cardiovascular:  ?   Rate and Rhythm: Normal rate and regular rhythm.  ?   Heart sounds: Normal heart sounds.  ?Pulmonary:  ?   Effort: Pulmonary effort is normal.  ?   Breath sounds: Normal breath sounds.  ?Skin: ?   General: Skin is warm and dry.  ?Neurological:  ?   Mental Status: She is alert and oriented to person, place, and time.  ?Psychiatric:     ?   Behavior: Behavior normal.  ? ? ? ?No results found for any visits on 01/31/22. ? ? ? ?The ASCVD Risk score (Arnett DK, et al., 2019) failed to calculate for the following reasons: ?  The 2019 ASCVD risk score is only valid for ages 109 to 18 ? ?  ?Assessment & Plan:  ? ?Problem List Items Addressed This Visit   ? ?  ? Other  ? Acute stress reaction - Primary  ?  She is doing really well on Lexapro 10 mg happy with her current dosing regimen.  Symptoms are significantly  improved.  Follow-up in 6 months.  If she feels like at any point she needs a referral for therapy or counseling please let 76 know but she does have good support network with her mom who also helps take care of her daughter, and a chaplain at work. ? ?  ?  ? ? ?Return in about 6 months (around 08/03/2022) for mood medication .  ? ? ?13/05/2022, MD ? ?

## 2022-01-31 NOTE — Assessment & Plan Note (Signed)
She is doing really well on Lexapro 10 mg happy with her current dosing regimen.  Symptoms are significantly improved.  Follow-up in 6 months.  If she feels like at any point she needs a referral for therapy or counseling please let us know but she does have good support network with her mom who also helps take care of her daughter, and a chaplain at work. ?

## 2022-07-05 ENCOUNTER — Ambulatory Visit
Admission: EM | Admit: 2022-07-05 | Discharge: 2022-07-05 | Disposition: A | Discharge status: Home / Self Care | Payer: Managed Care, Other (non HMO) | Attending: Family Medicine | Admitting: Family Medicine

## 2022-07-05 ENCOUNTER — Encounter: Payer: Self-pay | Admitting: Emergency Medicine

## 2022-07-05 DIAGNOSIS — N898 Other specified noninflammatory disorders of vagina: Secondary | ICD-10-CM | POA: Diagnosis present

## 2022-07-05 DIAGNOSIS — N3001 Acute cystitis with hematuria: Secondary | ICD-10-CM

## 2022-07-05 DIAGNOSIS — O99891 Other specified diseases and conditions complicating pregnancy: Secondary | ICD-10-CM | POA: Diagnosis not present

## 2022-07-05 LAB — POCT URINALYSIS DIP (MANUAL ENTRY)
Bilirubin, UA: NEGATIVE
Glucose, UA: NEGATIVE mg/dL
Ketones, POC UA: NEGATIVE mg/dL
Nitrite, UA: NEGATIVE
Protein Ur, POC: NEGATIVE mg/dL
Spec Grav, UA: 1.02 (ref 1.010–1.025)
Urobilinogen, UA: 0.2 E.U./dL
pH, UA: 5.5 (ref 5.0–8.0)

## 2022-07-05 LAB — POCT URINE PREGNANCY: Preg Test, Ur: NEGATIVE

## 2022-07-05 MED ORDER — METRONIDAZOLE 500 MG PO TABS: 500.0000 mg | ORAL_TABLET | Freq: Two times a day (BID) | ORAL | 0 refills | Status: DC

## 2022-07-05 MED ORDER — FLUCONAZOLE 150 MG PO TABS: ORAL_TABLET | ORAL | 0 refills | Status: DC

## 2022-07-05 MED ORDER — SULFAMETHOXAZOLE-TRIMETHOPRIM 800-160 MG PO TABS: 1.0000 | ORAL_TABLET | Freq: Two times a day (BID) | ORAL | 0 refills | Status: AC

## 2022-07-05 NOTE — ED Provider Notes (Signed)
[name]    CSN: [id number] Arrival date & time: 07/05/22  6269      History   Chief Complaint Chief Complaint  Patient presents with   Vaginal Discharge    HPI Alyssa Sparks is a 31 y.o. female.   HPI 31 year old female presents with vaginal discharge for 2 weeks.  Reports cottage cheeselike.  Patient also reports vaginal itching.  Patient is having uroseptic symptoms as well.  Has no concern for STD but would like to be checked.  PMH significant for morbid obesity, irregular periods, and PCOS.  Past Medical History:  Diagnosis Date   Anxiety    Broken arm    RIGHT   Gestational diabetes    Infertility, female    Polycystic ovarian disease     Patient Active Problem List   Diagnosis Date Noted   Encounter for induction of labor 10/18/2021   Gestational diabetes mellitus (GDM), antepartum 08/17/2021   Acute stress reaction 11/01/2019   Irregular periods 06/23/2019   Hirsutism 06/23/2019   Obesity with body mass index 30 or greater 06/23/2019   Pendulous breast 06/02/2016   Macromastia 06/02/2016   Chronic bilateral low back pain without sciatica 06/02/2016   PCOS (polycystic ovarian syndrome) 10/14/2015    History reviewed. No pertinent surgical history.  OB History     Gravida  1   Para  1   Term  1   Preterm      AB      Living  1      SAB      IAB      Ectopic      Multiple  0   Live Births  1            Home Medications    Prior to Admission medications   Medication Sig Start Date End Date Taking? Authorizing Provider  escitalopram (LEXAPRO) 10 MG tablet Take 1 tablet (10 mg total) by mouth daily. 12/13/21  Yes Hali Marry, MD  fluconazole (DIFLUCAN) 150 MG tablet Take 1 tab p.o. for vaginal candidiasis, may repeat 1 tab p.o. in 3 days if symptoms are not resolved. 07/05/22  Yes Eliezer Lofts, FNP  LARIN FE 1/20 1-20 MG-MCG tablet Take 1 tablet by mouth daily. 11/21/21  Yes [provider]   metroNIDAZOLE (FLAGYL) 500 MG tablet Take 1 tablet (500 mg total) by mouth 2 (two) times daily. 07/05/22  Yes Eliezer Lofts, FNP  sulfamethoxazole-trimethoprim (BACTRIM DS) 800-160 MG tablet Take 1 tablet by mouth 2 (two) times daily for 3 days. 07/05/22 07/08/22 Yes Eliezer Lofts, FNP    Family History Family History  Problem Relation Age of Onset   Breast cancer Other    Hypertension Mother    Diabetes Mother    Heart attack Father    Diabetes Maternal Grandmother    Hypertension Maternal Grandmother    Deep vein thrombosis Maternal Grandmother    Cancer Maternal Grandmother    Diabetes Maternal Grandfather    Hypertension Maternal Grandfather    Prostate cancer Maternal Grandfather    Cancer Maternal Grandfather        PROSTATE   COPD Maternal Grandfather     Social History Social History   Tobacco Use   Smoking status: Never   Smokeless tobacco: Never   Tobacco comments:    No tobacco use  Vaping Use   Vaping Use: Never used  Substance Use Topics   Alcohol use: Not Currently    Comment: Postpartum not  drinking currently   Drug use: No     Allergies   Patient has no known allergies.   Review of Systems Review of Systems  Genitourinary:  Positive for dysuria and vaginal discharge.  All other systems reviewed and are negative.    Physical Exam Triage Vital Signs ED Triage Vitals  Enc Vitals Group     BP 07/05/22 1012 (!) 138/92     Pulse Rate 07/05/22 1012 86     Resp 07/05/22 1012 18     Temp 07/05/22 1012 99.1 F (37.3 C)     Temp Source 07/05/22 1012 Oral     SpO2 07/05/22 1012 100 %     Weight 07/05/22 1014 224 lb (101.6 kg)     Height 07/05/22 1014 5\' 7"  (1.702 m)     Head Circumference --      Peak Flow --      Pain Score 07/05/22 1014 0     Pain Loc --      Pain Edu? --      Excl. in GC? --    No data found.  Updated Vital Signs BP (!) 138/92 (BP Location: Left Arm)   Pulse 86   Temp 99.1 F (37.3 C) (Oral)   Resp 18   Ht 5'  7" (1.702 m)   Wt 224 lb (101.6 kg)   LMP 06/06/2022   SpO2 100%   Breastfeeding No   BMI 35.08 kg/m      Physical Exam Vitals and nursing note reviewed.  Constitutional:      General: She is not in acute distress.    Appearance: Normal appearance. She is obese. She is not ill-appearing.  HENT:     Head: Normocephalic and atraumatic.     Mouth/Throat:     Mouth: Mucous membranes are moist.     Pharynx: Oropharynx is clear.  Eyes:     Extraocular Movements: Extraocular movements intact.     Conjunctiva/sclera: Conjunctivae normal.     Pupils: Pupils are equal, round, and reactive to light.  Cardiovascular:     Rate and Rhythm: Normal rate and regular rhythm.     Pulses: Normal pulses.     Heart sounds: Normal heart sounds.  Pulmonary:     Effort: Pulmonary effort is normal.     Breath sounds: Normal breath sounds. No wheezing, rhonchi or rales.  Abdominal:     Tenderness: There is no right CVA tenderness or left CVA tenderness.  Musculoskeletal:        General: Normal range of motion.     Cervical back: Normal range of motion and neck supple.  Skin:    General: Skin is warm and dry.  Neurological:     General: No focal deficit present.     Mental Status: She is alert and oriented to person, place, and time.  Psychiatric:        Mood and Affect: Mood normal.        Behavior: Behavior normal.      UC Treatments / Results  Labs (all labs ordered are listed, but only abnormal results are displayed) Labs Reviewed  POCT URINALYSIS DIP (MANUAL ENTRY) - Abnormal; Notable for the following components:      Result Value   Color, UA other (*)    Blood, UA trace-intact (*)    Leukocytes, UA Small (1+) (*)    All other components within normal limits  URINE CULTURE  POCT URINE PREGNANCY  CERVICOVAGINAL ANCILLARY ONLY  EKG   Radiology No results found.  Procedures Procedures (including critical care time)  Medications Ordered in UC Medications - No data to  display  Initial Impression / Assessment and Plan / UC Course  I have reviewed the triage vital signs and the nursing notes.  Pertinent labs & imaging results that were available during my care of the patient were reviewed by me and considered in my medical decision making (see chart for details).     MDM: 1.  Vaginal discharge-Aptima swab ordered, Rx'd Flagyl; 2. Vaginal itching-Rx'd Diflucan; 3.  Acute cystitis with hematuria-urine culture ordered, Rx'd Bactrim. Advised patient to take medications as directed with food to completion.  Encouraged patient to increase daily water intake to 64 ounces per day while taking these medications.  Advised patient we will follow-up with urine culture/Aptima swab results once received.  Advised patient if symptoms worsen and/or unresolved please follow-up with PCP or here for further evaluation.  Patient discharged home, hemodynamically stable. Final Clinical Impressions(s) / UC Diagnoses   Final diagnoses:  Vaginal discharge  Vaginal itching  Acute cystitis with hematuria     Discharge Instructions      Advised patient to take medications as directed with food to completion.  Encouraged patient to increase daily water intake to 64 ounces per day while taking these medications.  Advised patient we will follow-up with urine culture/Aptima swab results once received.  Advised patient if symptoms worsen and/or unresolved please follow-up with PCP or here for further evaluation.     ED Prescriptions     Medication Sig Dispense Auth. Provider   metroNIDAZOLE (FLAGYL) 500 MG tablet Take 1 tablet (500 mg total) by mouth 2 (two) times daily. 14 tablet Trevor Iha, FNP   fluconazole (DIFLUCAN) 150 MG tablet Take 1 tab p.o. for vaginal candidiasis, may repeat 1 tab p.o. in 3 days if symptoms are not resolved. 4 tablet Trevor Iha, FNP   sulfamethoxazole-trimethoprim (BACTRIM DS) 800-160 MG tablet Take 1 tablet by mouth 2 (two) times daily for 3  days. 6 tablet Trevor Iha, FNP      PDMP not reviewed this encounter.   Trevor Iha, FNP 07/05/22 1044

## 2022-07-05 NOTE — ED Triage Notes (Signed)
Patient c/o vaginal "cottage cheese" discharge for a couple of weeks, itchy.  Patient is having some urinary sx's.  No concern for STI but would like to be checked.

## 2022-07-05 NOTE — Discharge Instructions (Addendum)
Advised patient to take medications as directed with food to completion.  Encouraged patient to increase daily water intake to 64 ounces per day while taking these medications.  Advised patient we will follow-up with urine culture/Aptima swab results once received.  Advised patient if symptoms worsen and/or unresolved please follow-up with PCP or here for further evaluation.

## 2022-07-06 LAB — CERVICOVAGINAL ANCILLARY ONLY
Bacterial Vaginitis (gardnerella): NEGATIVE
Candida Glabrata: NEGATIVE
Candida Vaginitis: POSITIVE — AB
Chlamydia: NEGATIVE
Comment: NEGATIVE
Comment: NEGATIVE
Comment: NEGATIVE
Comment: NEGATIVE
Comment: NEGATIVE
Comment: NORMAL
Neisseria Gonorrhea: NEGATIVE
Trichomonas: NEGATIVE

## 2022-07-06 LAB — URINE CULTURE: Culture: <10000 — AB

## 2022-08-03 ENCOUNTER — Ambulatory Visit (INDEPENDENT_AMBULATORY_CARE_PROVIDER_SITE_OTHER): Payer: Managed Care, Other (non HMO) | Admitting: Family Medicine

## 2022-08-03 ENCOUNTER — Encounter: Payer: Self-pay | Admitting: Family Medicine

## 2022-08-03 VITALS — BP 128/81 | HR 88 | Ht 67.0 in | Wt 226.0 lb

## 2022-08-03 DIAGNOSIS — F430 Acute stress reaction: Secondary | ICD-10-CM

## 2022-08-03 DIAGNOSIS — N926 Irregular menstruation, unspecified: Secondary | ICD-10-CM | POA: Diagnosis not present

## 2022-08-03 NOTE — Progress Notes (Signed)
Pt reports that she stopped taking the Lexapro 10 mg 1 month ago. She said that she has been doing better.

## 2022-08-03 NOTE — Assessment & Plan Note (Signed)
Doing well overall. Off medication.

## 2022-08-03 NOTE — Progress Notes (Signed)
   Established Patient Office Visit  Subjective   Patient ID: Alyssa Sparks, female    DOB: [birth year]  Age: 31 y.o. MRN: [id number]  Chief Complaint  Patient presents with   acute stress reaction    HPI Acute stress reaction-overall she is doing really well she is still living with her baby's father but they are really living is more is remained in separate spaces until the lease is up.  She actually came off her Lexapro about a month ago she missed quite a few tabs and so just quit taking it completely but she does not feel like there is a big difference she feel like things are actually okay work has been manageable.  She just feels tired a lot.  She was at urgent care couple weeks ago for a yeast infection was treated she is feeling better.  Her flu shot updated through work.  Just on birth control.  She is thinking about getting nexplanon.       ROS    Objective:     BP 128/81   Pulse 88   Ht 5\' 7"  (1.702 m)   Wt 226 lb (102.5 kg)   LMP 07/11/2022   SpO2 99%   BMI 35.40 kg/m    Physical Exam Vitals and nursing note reviewed.  Constitutional:      Appearance: She is well-developed.  HENT:     Head: Normocephalic and atraumatic.  Cardiovascular:     Rate and Rhythm: Normal rate and regular rhythm.     Heart sounds: Normal heart sounds.  Pulmonary:     Effort: Pulmonary effort is normal.     Breath sounds: Normal breath sounds.  Skin:    General: Skin is warm and dry.  Neurological:     Mental Status: She is alert and oriented to person, place, and time.  Psychiatric:        Behavior: Behavior normal.      No results found for any visits on 08/03/22.    The ASCVD Risk score (Arnett DK, et al., 2019) failed to calculate for the following reasons:   The 2019 ASCVD risk score is only valid for ages 67 to 31    Assessment & Plan:   Problem List Items Addressed This Visit       Other   Irregular periods    Wanting to change to nexplanon.  Has  follow-up with OB coming up and will discuss with them.      Acute stress reaction - Primary    Doing well overall. Off medication.         Return if symptoms worsen or fail to improve.    77, MD

## 2022-08-03 NOTE — Assessment & Plan Note (Signed)
Wanting to change to nexplanon.  Has follow-up with OB coming up and will discuss with them.

## 2022-09-25 NOTE — L&D Delivery Note (Signed)
Operative Delivery Note At 1:45 AM a viable female was delivered via Vaginal, Spontaneous.  Presentation: vertex; Position: Occiput,, Anterior; Station: +2.  Verbal consent: obtained from patient.  Risks and benefits discussed in detail.  Risks include, but are not limited to the risks of anesthesia, bleeding, infection, damage to maternal tissues, fetal cephalhematoma.  There is also the risk of inability to effect vaginal delivery of the head, or shoulder dystocia that cannot be resolved by established maneuvers, leading to the need for emergency cesarean section.  Shoulder dystocia encountered during delivery Delivery of the head: 06/13/2023  1:45 AM First maneuver: 06/13/2023  1:45 AM, McRoberts Second maneuver: 06/13/2023  1:45 AM, Suprapubic Pressure Third maneuver: 06/13/2023  1:45 AM,  delivery of posterior arm    APGAR: 8 and 9 at 1 and 5 minutes respectively; weight 9 lb 2.4 oz (4150 g).   Placenta status: spontaneously delivered at 0205.   Cord:  3 vessels intact with the following complications: none.    Anesthesia:  Epidural Instruments: Kiwi Vacuum Episiotomy: None Lacerations: 1st degree Suture Repair: 3.0 vicryl Est. Blood Loss (mL): 255  Mom to postpartum.  Baby to Couplet care / Skin to Skin.   LABOR AND DELIVERY SUMMARY The patient is a 32yo now [id number] who presented at [redacted]w[redacted]d for scheduled IOL for suspected LGA and h/o shoulder dystocia with clavicle fracture in prior delivery. Patient extensively counseled on risk of vaginal delivery and possible repeat shoulder dystocia and associated sequale. Offered primary c-section d/t h/o shoulder dystocia during antenatal care and again during admission. R/B/A discussed. Patient verbalized understanding and desires attempted vaginal delivery.  She was admitted, placed on continuous EFM and toco. Proceeded with IOL. IOL was started with 25 mcg Cytotec PV and patient progressed from 1/th/hi to 3/50/-3. She was then started on pitocin.  Labor progressed and patient had SROM with clear fluid at 2140. She received an epidural for pain control.   The patient progressed to completely effaced and dilated at 0119 and she had the urge to push. Patient began pushing and was noted to have recurrent decelerations. Recommendation made to proceed with vacuum assisted vaginal delivery to expedite delivery d/t non reassuring fetal heart tones. Indications, benefits, risks, expectations and possible complications were reviewed and she verbalized her consent. Risks include, but are not limited to the risks of anesthesia, bleeding, infection, damage to maternal tissues, fetal cephalhematoma. There is also the risk of inability to effect vaginal delivery of the head, or shoulder dystocia that cannot be resolved by established maneuvers, leading to the need for emergency cesarean section. Kiwi Vacuum was applied at +2 station, pressure increased to the green zone, vacuum pulled with maternal efforts of pushing. Infant head delivered after 3 pulls in coordination with maternal pushing over 1 contraction with no pop-offs. Upon delivery of infant head vacuum pressure was released and vacuum removed with a total application time of about 30 seconds. Upon delivery of infant head shoulder dystocia was encountered. It was immediately recognized and called out to time. McRoberts was performed followed by suprapubic pressure toward maternal right side. Anterior shoulder was unable to be delivered and attempt made to delivery posterior arm. Posterior arm (right) delivered followed immediately by anterior shoulder (left) and shoulder dystocia resolved after about 35 seconds. "Pop" palpable during delivery of posterior arm and x-ray confirmed humerus fracture of right arm.   A liveborn female neonate was delivered at 0145 over an intact perineum. No nuchal cord present. Spontaneous cry noted. Cord was doubly  clamped and cut. Infant was transferred to a warmer with an  awaiting NICU team who was called to bedside prior to delivery. Cord gases obtained. Pitocin was added to the patient's IV.  The placenta delivered spontaneously and apparently intact with a 3 vessel cord at 0205 and discarded.  Uterus was noted to be firm.  1sy degree perineal laceration noted and closed in the usual fashion with 3-0 vicryl suture. No additional cervical, vaginal, labial or periuretheral lacerations were identified. All sponge, needle and instrument counts were correct at the end of the procedure.  The patient and baby remained in the LDR in stable condition. Dr. Hester Mates  was present for the entire delivery.  Baby Name: Alyssa Sparks  Dr. Reesa Chew 06/13/23 3:21 AM

## 2022-10-02 ENCOUNTER — Telehealth: Payer: Self-pay | Admitting: General Practice

## 2022-10-02 NOTE — Telephone Encounter (Signed)
Transition Care Management Follow-up Telephone Call Date of discharge and from where: 10/01/22 from [place] How have you been since you were released from the hospital? Doing better; she has started the antibiotic and has a follow up scheduled for Thursday. Any questions or concerns? No  Items Reviewed: Did the pt receive and understand the discharge instructions provided? Yes  Medications obtained and verified? No  Other? No  Any new allergies since your discharge? No  Dietary orders reviewed? Yes Do you have support at home? Yes   Home Care and Equipment/Supplies: Were home health services ordered? no  Functional Questionnaire: (I = Independent and D = Dependent) ADLs: I  Bathing/Dressing- I  Meal Prep- I  Eating- I  Maintaining continence- I  Transferring/Ambulation- I  Managing Meds- I  Follow up appointments reviewed:  PCP Hospital f/u appt confirmed? Yes  Scheduled to see Dr. Madilyn Fireman on Thursday.  [facility] f/u appt confirmed? No   Are transportation arrangements needed? No  If their condition worsens, is the pt aware to call PCP or go to the Emergency Dept.? Yes Was the patient provided with contact information for the PCP's office or ED? Yes Was to pt encouraged to call back with questions or concerns? Yes

## 2022-10-05 ENCOUNTER — Ambulatory Visit (INDEPENDENT_AMBULATORY_CARE_PROVIDER_SITE_OTHER): Payer: Managed Care, Other (non HMO) | Admitting: Family Medicine

## 2022-10-05 ENCOUNTER — Encounter: Payer: Self-pay | Admitting: Family Medicine

## 2022-10-05 VITALS — BP 134/77 | HR 88 | Ht 67.0 in | Wt 231.0 lb

## 2022-10-05 DIAGNOSIS — R21 Rash and other nonspecific skin eruption: Secondary | ICD-10-CM

## 2022-10-05 DIAGNOSIS — N6320 Unspecified lump in the left breast, unspecified quadrant: Secondary | ICD-10-CM | POA: Diagnosis not present

## 2022-10-05 NOTE — Patient Instructions (Signed)
Continue with topical hydrocortisone cream since it seems to be helping.  We did send off a skin scraping and we will call you once that is back in case we need to change creams.

## 2022-10-05 NOTE — Addendum Note (Signed)
Addended by: Teddy Spike on: 10/05/2022 04:28 PM   Modules accepted: Orders

## 2022-10-05 NOTE — Progress Notes (Signed)
   Acute Office Visit  Subjective:     Patient ID: Alyssa Sparks, female    DOB: 1990-12-28, 32 y.o.   MRN: [id number]  Chief Complaint  Patient presents with   Rash    HPI Patient is in today for rash above her left breast.  She says that it is itchy but not painful in fact she went to urgent care on 1/7  They prescribed prednisone an topical hydrocortisone cream.  She also noted a hardness under the skin in a linear vertical distribution going from the top of the breast towards the nipple after her daughter had hit her breast.  It is not bothersome but if you press firmly it is tender.  Rash is better to topical hydrocortisone.  ROS      Objective:    BP 134/77   Pulse 88   Ht 5\' 7"  (1.702 m)   Wt 231 lb (104.8 kg)   SpO2 99%   BMI 36.18 kg/m    Physical Exam Vitals reviewed.  Constitutional:      Appearance: She is well-developed.  HENT:     Head: Normocephalic and atraumatic.  Eyes:     Conjunctiva/sclera: Conjunctivae normal.  Pulmonary:     Effort: Pulmonary effort is normal.  Chest:       Comments: No axillary tenderness Skin:    General: Skin is dry.     Coloration: Skin is not pale.  Neurological:     Mental Status: She is alert and oriented to person, place, and time.  Psychiatric:        Behavior: Behavior normal.     No results found for any visits on 10/05/22.      Assessment & Plan:   Problem List Items Addressed This Visit   None Visit Diagnoses     Mass of left breast, unspecified quadrant    -  Primary   Relevant Orders   US BREAST COMPLETE UNI LEFT INC AXILLA   MM Digital Diagnostic Unilat L   Rash          The linear firmness underneath the left breast is possibly phlebitis.  So we discussed using an NSAID, ibuprofen 6 or milligrams 3 times daily for a week.  Stop the medication if it starts to bother her stomach or cause irritation.  I will go ahead and refer her for diagnostic mammo and ultrasound for further workup I did not  palpate a discrete lymph node.  And it does not feel like an inflamed duct.  No evidence of mastitis over the nipple.  Rash -Skin scraping performed.  Will call with results if we need to switch topical medications otherwise continue with the hydrocortisone since it seems to be improving the rash.   No orders of the defined types were placed in this encounter.   No follow-ups on file.  Beatrice Lecher, MD

## 2022-10-09 LAB — FUNGAL STAIN
FUNGAL SMEAR:: NONE SEEN
MICRO NUMBER:: 14422602
SPECIMEN QUALITY:: ADEQUATE

## 2022-10-09 NOTE — Progress Notes (Signed)
HI Ruari,  Skin scraping is negative for any fungal elements which is very reassuring okay to continue with the topical steroid cream hopefully it should completely resolve in the next 1 to 2 weeks.

## 2022-10-10 ENCOUNTER — Encounter: Payer: Self-pay | Admitting: Family Medicine

## 2022-11-07 ENCOUNTER — Encounter: Payer: Self-pay | Admitting: *Deleted

## 2022-11-20 ENCOUNTER — Encounter: Payer: Self-pay | Admitting: *Deleted

## 2022-11-21 ENCOUNTER — Encounter: Payer: Self-pay | Admitting: Obstetrics and Gynecology

## 2022-11-22 ENCOUNTER — Encounter: Payer: Self-pay | Admitting: Obstetrics and Gynecology

## 2022-11-22 ENCOUNTER — Ambulatory Visit: Payer: Managed Care, Other (non HMO)

## 2022-11-22 ENCOUNTER — Ambulatory Visit (INDEPENDENT_AMBULATORY_CARE_PROVIDER_SITE_OTHER): Payer: Managed Care, Other (non HMO) | Admitting: Obstetrics and Gynecology

## 2022-11-22 VITALS — BP 116/70 | HR 90 | Wt 224.0 lb

## 2022-11-22 DIAGNOSIS — Z8759 Personal history of other complications of pregnancy, childbirth and the puerperium: Secondary | ICD-10-CM | POA: Insufficient documentation

## 2022-11-22 DIAGNOSIS — Z3490 Encounter for supervision of normal pregnancy, unspecified, unspecified trimester: Secondary | ICD-10-CM

## 2022-11-22 DIAGNOSIS — Z3481 Encounter for supervision of other normal pregnancy, first trimester: Secondary | ICD-10-CM

## 2022-11-22 DIAGNOSIS — O09299 Supervision of pregnancy with other poor reproductive or obstetric history, unspecified trimester: Secondary | ICD-10-CM | POA: Insufficient documentation

## 2022-11-22 DIAGNOSIS — Z3A10 10 weeks gestation of pregnancy: Secondary | ICD-10-CM

## 2022-11-22 DIAGNOSIS — E282 Polycystic ovarian syndrome: Secondary | ICD-10-CM

## 2022-11-22 DIAGNOSIS — Z6835 Body mass index (BMI) 35.0-35.9, adult: Secondary | ICD-10-CM

## 2022-11-22 DIAGNOSIS — Z8632 Personal history of gestational diabetes: Secondary | ICD-10-CM | POA: Insufficient documentation

## 2022-11-22 HISTORY — DX: Personal history of other complications of pregnancy, childbirth and the puerperium: Z87.59

## 2022-11-22 LAB — OB RESULTS CONSOLE GC/CHLAMYDIA: Neisseria Gonorrhea: NEGATIVE

## 2022-11-22 MED ORDER — ASPIRIN 81 MG PO TBEC: 81.0000 mg | DELAYED_RELEASE_TABLET | Freq: Every day | ORAL | 2 refills | Status: DC

## 2022-11-22 NOTE — Progress Notes (Signed)
Last pap 1 year ago

## 2022-11-22 NOTE — Progress Notes (Signed)
Subjective:   Alyssa Sparks is a 32 y.o. G2P1001 at 69w1dby LMP c/w [place] being seen today for her first obstetrical visit.  Her obstetrical history is significant for  BMI 35, hx shoulder dystocia (G1), hx GDM (G1), and PCOS  . Pregnancy history fully reviewed.  Patient reports nausea. Unplanned pregnancy, but accepting.  HISTORY: OB History  Gravida Para Term Preterm AB Living  '2 1 1 '$ 0 0 1  SAB IAB Ectopic Multiple Live Births  0 0 0 0 1    # Outcome Date GA Lbr Len/2nd Weight Sex Delivery Anes PTL Lv  2 Current           1 Term 10/19/21 481w1d8 lb 6 oz (3.8 kg) F Vag-Spont EPI  LIV     Complications: Shoulder Dystocia     Name: Brizuela,GIRL Savina     Apgar1: 6 Ronceverte7    Last pap smear: Normal per pt report, [place]:  Diagnosis Date   Anxiety    Broken arm    RIGHT   Gestational diabetes    Infertility, female    Polycystic ovarian disease    History reviewed. No pertinent surgical history. Family History  Problem Relation Age of Onset   Breast cancer Other    Hypertension Mother    Diabetes Mother    Heart attack Father    Diabetes Maternal Grandmother    Hypertension Maternal Grandmother    Deep vein thrombosis Maternal Grandmother    Cancer Maternal Grandmother    Diabetes Maternal Grandfather    Hypertension Maternal Grandfather    Prostate cancer Maternal Grandfather    Cancer Maternal Grandfather        PROSTATE   COPD Maternal Grandfather    Social History   Tobacco Use   Smoking status: Never   Smokeless tobacco: Never   Tobacco comments:    No tobacco use  Vaping Use   Vaping Use: Never used  Substance Use Topics   Alcohol use: Not Currently    Comment: Postpartum not drinking currently   Drug use: No   No Known Allergies Current Outpatient Medications on File Prior to Visit  Medication Sig Dispense Refill   ondansetron (ZOFRAN) 8 MG tablet      Prenatal Vit-Fe Fumarate-FA  (PRENATAL VITAMIN PO) Take by mouth.     No current facility-administered medications on file prior to visit.   Exam   Vitals:   11/22/22 0937  BP: 116/70  Pulse: 90  Weight: 224 lb (101.6 kg)   General:  Alert, oriented and cooperative. Patient is in no acute distress.  Cardiovascular: Normal heart rate noted  Respiratory: Normal respiratory effort, no problems with respiration noted  Abdomen: Soft, gravid, appropriate for gestational age.  Pain/Pressure: Absent     Extremities: Normal range of motion.  Edema: None  Mental Status: Normal mood and affect. Normal behavior. Normal judgment and thought content.    Assessment:   Pregnancy: G2P1001 Patient Active Problem List   Diagnosis Date Noted   Supervision of normal pregnancy 11/22/2022   History of gestational diabetes mellitus (GDM) 11/22/2022   History of shoulder dystocia in prior pregnancy 11/22/2022   Acute stress reaction 11/01/2019   Macromastia 06/02/2016   Chronic bilateral low back pain without sciatica 06/02/2016   PCOS (polycystic ovarian syndrome) 10/14/2015   Plan:  1. Encounter for supervision of normal pregnancy, antepartum, unspecified gravidity Initial labs drawn.  Continue prenatal vitamins. Genetic Screening discussed: NIPS, carrier screening and AFP, declined. Ultrasound discussed; fetal anatomic survey: requested. Problem list reviewed and updated. The nature of [place] with multiple MDs and other Advanced Practice Providers was explained to patient; also emphasized that residents, students are part of our team. Routine obstetric precautions reviewed. - Pregnancy, Initial Screen - US OB Limited; Future - Babyscripts Schedule Optimization - HgB A1c  2. PCOS (polycystic ovarian syndrome) 3. History of gestational diabetes mellitus (GDM) Discussed dietary/exercise adjustments that could reduce risk of GDM with this pregnancy - HgB A1c  4. History of  shoulder dystocia in prior pregnancy G1 pregnancy. Resolved with McRoberts & suprapubic. Time not noted. Resulted in clavicle fracture for baby.  Reviewed risk of shoulder dystocia with this pregnancy and potential option for cesarean delivery. Will discuss in more depth later in pregnancy  5. BMI 35.0-35.9,adult Reviewed ldASA for preeclampsia prevention, pt accepting Rx for ldASA to start at 12 weeks   Return in about 4 weeks (around 12/20/2022) for return OB at 14-15 weeks.  Gale Journey, MD [place], [facility] for [employer], [place]

## 2022-11-22 NOTE — Patient Instructions (Signed)

## 2022-11-23 LAB — HEMOGLOBIN A1C
Est. average glucose Bld gHb Est-mCnc: 94 mg/dL
Hgb A1c MFr Bld: 4.9 % (ref 4.8–5.6)

## 2022-11-25 LAB — PREGNANCY, INITIAL SCREEN
Antibody Screen: NEGATIVE
Basophils Absolute: 0 10*3/uL (ref 0.0–0.2)
Basos: 0 %
Chlamydia trachomatis, NAA: NEGATIVE
EOS (ABSOLUTE): 0 10*3/uL (ref 0.0–0.4)
Eos: 0 %
Glucose, UA: NEGATIVE
HCV Ab: NONREACTIVE
HIV Screen 4th Generation wRfx: NONREACTIVE
Hematocrit: 37.2 % (ref 34.0–46.6)
Hemoglobin: 12.3 g/dL (ref 11.1–15.9)
Hepatitis B Surface Ag: NEGATIVE
Immature Grans (Abs): 0 10*3/uL (ref 0.0–0.1)
Immature Granulocytes: 0 %
Leukocytes,UA: NEGATIVE
Lymphocytes Absolute: 1.6 10*3/uL (ref 0.7–3.1)
Lymphs: 16 %
MCH: 31.8 pg (ref 26.6–33.0)
MCHC: 33.1 g/dL (ref 31.5–35.7)
MCV: 96 fL (ref 79–97)
Monocytes Absolute: 0.5 10*3/uL (ref 0.1–0.9)
Monocytes: 5 %
Neisseria Gonorrhoeae by PCR: NEGATIVE
Neutrophils Absolute: 7.6 10*3/uL — ABNORMAL HIGH (ref 1.4–7.0)
Neutrophils: 79 %
Nitrite, UA: NEGATIVE
Platelets: 332 10*3/uL (ref 150–450)
RBC, UA: NEGATIVE
RBC: 3.87 x10E6/uL (ref 3.77–5.28)
RDW: 11.1 % — ABNORMAL LOW (ref 11.7–15.4)
RPR Ser Ql: NONREACTIVE
Rh Factor: POSITIVE
Rubella Antibodies, IGG: 1.86 index (ref >0.99)
Specific Gravity, UA: 1.028 (ref 1.005–1.030)
Urobilinogen, Ur: 1 mg/dL (ref 0.2–1.0)
WBC: 9.7 10*3/uL (ref 3.4–10.8)
pH, UA: 5.5 (ref 5.0–7.5)

## 2022-11-25 LAB — MICROSCOPIC EXAMINATION
Bacteria, UA: NONE SEEN
Casts: NONE SEEN /lpf

## 2022-11-25 LAB — HCV INTERPRETATION

## 2022-11-25 LAB — URINE CULTURE, OB REFLEX: Organism ID, Bacteria: NO GROWTH

## 2022-11-30 ENCOUNTER — Telehealth: Payer: Self-pay

## 2022-11-30 ENCOUNTER — Telehealth: Payer: Self-pay | Admitting: *Deleted

## 2022-11-30 NOTE — Telephone Encounter (Signed)
Pt called stating that she has N&V for 2 days and cannot keep anything down.  She does have Zofran but stated that she took one this AM but threw it back up.  I encouraged her to try it again and after 30 minutes of taking it to try Sprite,Ginger Ale or Gatorade.  If she continues to throw everything up She should go to [facility] and [facility] for [facility] fluids.  Pt agrees with the plan.

## 2022-11-30 NOTE — Telephone Encounter (Signed)
Pt called office earlier with questions. Attempted to return pt call. Pt did not answer. VM left asking pt to call office. MyChart message also sent.

## 2022-12-02 ENCOUNTER — Other Ambulatory Visit: Payer: Self-pay

## 2022-12-02 ENCOUNTER — Ambulatory Visit
Admission: EM | Admit: 2022-12-02 | Discharge: 2022-12-02 | Disposition: A | Discharge status: Home / Self Care | Payer: Managed Care, Other (non HMO) | Attending: Urgent Care | Admitting: Urgent Care

## 2022-12-02 DIAGNOSIS — J101 Influenza due to other identified influenza virus with other respiratory manifestations: Secondary | ICD-10-CM

## 2022-12-02 LAB — POC INFLUENZA A AND B ANTIGEN (URGENT CARE ONLY)
Influenza A Ag: POSITIVE — AB
Influenza B Ag: NEGATIVE

## 2022-12-02 MED ORDER — BUDESONIDE 32 MCG/ACT NA SUSP: 1.0000 | Freq: Two times a day (BID) | NASAL | 0 refills | Status: DC

## 2022-12-02 NOTE — Discharge Instructions (Addendum)
Your test is positive for Influenza A. You are past the window of treatment, thus rest and hydration is recommended. Try to increase your water intake. Please continue tylenol as needed for aches or fever. You can take up to 3,'000mg'$  daily from all sources. Refer to your list provided by your OB for medications safe OTC. I have prescribed a nasal spray to help with your congestion and sneezing. Regarding your morning sickness, in addition to your zofran, B6 and ginger can sometimes be effective.

## 2022-12-02 NOTE — ED Provider Notes (Signed)
[name]    CSN: [id number] Arrival date & time: 12/02/22  1257      History   Chief Complaint Chief Complaint  Patient presents with   Cough   Nasal Congestion   Fever    HPI Alyssa Sparks is a 32 y.o. female.   32 year old female who is [redacted] weeks pregnant presents today due to concerns of nasal congestion and sneezing primarily.  She reports that her symptoms started on Tuesday or Wednesday of last week.  Her fianc tested positive for influenza on Thursday.  Patient states she got the influenza vaccine where as he did not, and his symptoms are more severe.  Patient reports a very mild cough.  She has been taking over-the-counter Robitussin.  Patient states she has been having severe morning sickness for which she takes Zofran as directed by her OB.  Patient denies any fevers, states she "stays cold".  She denies any additional complaints at this time. Pt denies GI sx apart from her pregnancy induced morning sickness.   Cough Associated symptoms: fever   Fever Associated symptoms: cough     Past Medical History:  Diagnosis Date   Anxiety    Broken arm    RIGHT   Gestational diabetes    Infertility, female    Polycystic ovarian disease     Patient Active Problem List   Diagnosis Date Noted   Supervision of normal pregnancy 11/22/2022   History of gestational diabetes mellitus (GDM) 11/22/2022   History of shoulder dystocia in prior pregnancy 11/22/2022   Acute stress reaction 11/01/2019   BMI 35.0-35.9,adult 06/23/2019   Macromastia 06/02/2016   Chronic bilateral low back pain without sciatica 06/02/2016   PCOS (polycystic ovarian syndrome) 10/14/2015    History reviewed. No pertinent surgical history.  OB History     Gravida  2   Para  1   Term  1   Preterm      AB      Living  1      SAB      IAB      Ectopic      Multiple  0   Live Births  1            Home Medications    Prior to Admission medications    Medication Sig Start Date End Date Taking? Authorizing Provider  budesonide (RHINOCORT ALLERGY) 32 MCG/ACT nasal spray Place 1 spray into both nostrils 2 (two) times daily. 12/02/22  Yes Werner Labella L, PA  aspirin EC 81 MG tablet Take 1 tablet (81 mg total) by mouth daily. Start taking when you are [redacted] weeks pregnant for rest of pregnancy for prevention of preeclampsia 11/22/22   Inez Catalina, MD  ondansetron [facility]) 8 MG tablet     [provider]  Prenatal Vit-Fe Fumarate-FA (PRENATAL VITAMIN PO) Take by mouth.    [provider]    Family History Family History  Problem Relation Age of Onset   Breast cancer Other    Hypertension Mother    Diabetes Mother    Heart attack Father    Diabetes Maternal Grandmother    Hypertension Maternal Grandmother    Deep vein thrombosis Maternal Grandmother    Cancer Maternal Grandmother    Diabetes Maternal Grandfather    Hypertension Maternal Grandfather    Prostate cancer Maternal Grandfather    Cancer Maternal Grandfather        PROSTATE   COPD Maternal Grandfather  Social History Social History   Tobacco Use   Smoking status: Never   Smokeless tobacco: Never   Tobacco comments:    No tobacco use  Vaping Use   Vaping Use: Never used  Substance Use Topics   Alcohol use: Not Currently    Comment: Postpartum not drinking currently   Drug use: No     Allergies   Patient has no known allergies.   Review of Systems Review of Systems  Constitutional:  Positive for fever.  Respiratory:  Positive for cough.   As per HPI    Physical Exam Triage Vital Signs ED Triage Vitals  Enc Vitals Group     BP 12/02/22 1307 110/73     Pulse Rate 12/02/22 1307 (!) 112     Resp 12/02/22 1307 17     Temp 12/02/22 1307 99.4 F (37.4 C)     Temp Source 12/02/22 1307 Oral     SpO2 12/02/22 1307 99 %     Weight --      Height --      Head Circumference --      Peak Flow --      Pain Score 12/02/22 1308 0      Pain Loc --      Pain Edu? --      Excl. in [place]? --    No data found.  Updated Vital Signs BP 110/73 (BP Location: Right Arm)   Pulse (!) 112   Temp 99.4 F (37.4 C) (Oral)   Resp 17   LMP 09/12/2022   SpO2 99%   Visual Acuity Right Eye Distance:   Left Eye Distance:   Bilateral Distance:    Right Eye Near:   Left Eye Near:    Bilateral Near:     Physical Exam Vitals and nursing note reviewed.  Constitutional:      General: She is not in acute distress.    Appearance: Normal appearance. She is well-developed. She is not ill-appearing, toxic-appearing or diaphoretic.     Comments: Nasal congested voice quality  HENT:     Head: Normocephalic and atraumatic.     Right Ear: Tympanic membrane, ear canal and external ear normal. There is no impacted cerumen.     Left Ear: Tympanic membrane, ear canal and external ear normal. There is no impacted cerumen.     Nose: Congestion and rhinorrhea present.     Mouth/Throat:     Mouth: Mucous membranes are moist.     Pharynx: Oropharynx is clear. No oropharyngeal exudate or posterior oropharyngeal erythema.  Eyes:     General: No scleral icterus.       Right eye: No discharge.        Left eye: No discharge.     Extraocular Movements: Extraocular movements intact.     Conjunctiva/sclera: Conjunctivae normal.     Pupils: Pupils are equal, round, and reactive to light.  Cardiovascular:     Rate and Rhythm: Regular rhythm. Tachycardia present.     Pulses: Normal pulses.     Heart sounds: No murmur heard. Pulmonary:     Effort: Pulmonary effort is normal. No accessory muscle usage, respiratory distress or retractions.     Breath sounds: Normal breath sounds and air entry. No stridor, decreased air movement or transmitted upper airway sounds. No decreased breath sounds, wheezing, rhonchi or rales.  Chest:     Chest wall: No tenderness.  Musculoskeletal:        General: No swelling.  Cervical back: Normal range of motion and neck  supple. No rigidity or tenderness.  Lymphadenopathy:     Cervical: No cervical adenopathy.  Skin:    General: Skin is warm and dry.     Capillary Refill: Capillary refill takes less than 2 seconds.  Neurological:     Mental Status: She is alert.  Psychiatric:        Mood and Affect: Mood normal.      UC Treatments / Results  Labs (all labs ordered are listed, but only abnormal results are displayed) Labs Reviewed  POC INFLUENZA A AND B ANTIGEN (URGENT CARE ONLY) - Abnormal; Notable for the following components:      Result Value   Influenza A Ag Positive (*)    All other components within normal limits    EKG   Radiology No results found.  Procedures Procedures (including critical care time)  Medications Ordered in UC Medications - No data to display  Initial Impression / Assessment and Plan / UC Course  I have reviewed the triage vital signs and the nursing notes.  Pertinent labs & imaging results that were available during my care of the patient were reviewed by me and considered in my medical decision making (see chart for details).     Influenza A - pt is past the treatment window for use of tamiflu. VSS, no fever noted. Rest and hydrate. Will start budesonide nasal spray which is safe with pregnancy to help with nasal congestion and sneezing. Pt to call OB and notify them of positive test. Morning sickness - being managed by OB. Pt has Rx for PRN zofran.   Final Clinical Impressions(s) / UC Diagnoses   Final diagnoses:  Influenza A     Discharge Instructions      Your test is positive for Influenza A. You are past the window of treatment, thus rest and hydration is recommended. Try to increase your water intake. Please continue tylenol as needed for aches or fever. You can take up to 3,'000mg'$  daily from all sources. Refer to your list provided by your OB for medications safe OTC. I have prescribed a nasal spray to help with your congestion and  sneezing. Regarding your morning sickness, in addition to your zofran, B6 and ginger can sometimes be effective.     ED Prescriptions     Medication Sig Dispense Auth. Provider   budesonide (RHINOCORT ALLERGY) 32 MCG/ACT nasal spray Place 1 spray into both nostrils 2 (two) times daily. 8.43 mL Cigi Bega L, PA      PDMP not reviewed this encounter.   Chaney Malling, [place] 12/02/22 1331

## 2022-12-02 NOTE — ED Triage Notes (Signed)
Pt c/o cough, congestion and fever since Monday night. Currently [redacted] wks pregnant. Fiance tested pos for flu on Thurs. Zofran and robitussin prn.

## 2022-12-21 ENCOUNTER — Ambulatory Visit: Payer: Managed Care, Other (non HMO) | Admitting: Obstetrics and Gynecology

## 2022-12-21 VITALS — BP 120/68 | HR 90 | Wt 218.0 lb

## 2022-12-21 DIAGNOSIS — Z3A14 14 weeks gestation of pregnancy: Secondary | ICD-10-CM

## 2022-12-21 DIAGNOSIS — Z8759 Personal history of other complications of pregnancy, childbirth and the puerperium: Secondary | ICD-10-CM | POA: Diagnosis not present

## 2022-12-21 DIAGNOSIS — Z3482 Encounter for supervision of other normal pregnancy, second trimester: Secondary | ICD-10-CM

## 2022-12-21 DIAGNOSIS — Z6834 Body mass index (BMI) 34.0-34.9, adult: Secondary | ICD-10-CM

## 2022-12-21 DIAGNOSIS — Z8632 Personal history of gestational diabetes: Secondary | ICD-10-CM | POA: Diagnosis not present

## 2022-12-21 LAB — CYTOLOGY - PAP: Pap: NEGATIVE

## 2022-12-21 NOTE — Progress Notes (Signed)
   PRENATAL VISIT NOTE  Subjective:  Alyssa Sparks is a 32 y.o. G2P1001 at [redacted]w[redacted]d being seen today for ongoing prenatal care.  She is currently monitored for the following issues for this low-risk pregnancy and has PCOS (polycystic ovarian syndrome); Macromastia; Chronic bilateral low back pain without sciatica; BMI 35.0-35.9,adult; Acute stress reaction; Supervision of normal pregnancy; History of gestational diabetes mellitus (GDM); and History of shoulder dystocia in prior pregnancy on their problem list.  Patient reports no complaints. Recently had flu with n/v, symptoms now resolved.  Contractions: Not present. Vag. Bleeding: None.  Movement: Absent. Denies leaking of fluid.   The following portions of the patient's history were reviewed and updated as appropriate: allergies, current medications, past family history, past medical history, past social history, past surgical history and problem list.   Objective:   Vitals:   12/21/22 1011  BP: 120/68  Pulse: 90  Weight: 218 lb (98.9 kg)   Fetal Status: Fetal Heart Rate (bpm): 154   Movement: Absent     General:  Alert, oriented and cooperative. Patient is in no acute distress.  Skin: Skin is warm and dry. No rash noted.   Cardiovascular: Normal heart rate noted  Respiratory: Normal respiratory effort, no problems with respiration noted  Abdomen: Soft, gravid, appropriate for gestational age.  Pain/Pressure: Absent      Assessment and Plan:  Pregnancy: G2P1001 at [redacted]w[redacted]d 1. Encounter for supervision of other normal pregnancy in second trimester 2. [redacted] weeks gestation of pregnancy Discussed option for AFP next visit - [place] MFM OB DETAIL +14 [place]; Future  3. History of shoulder dystocia in prior pregnancy  4. History of gestational diabetes mellitus (GDM) Early A1c 4.9  Return in about 4 weeks (around 01/18/2023) for return OB at 18 weeks.  Inez Catalina, MD

## 2023-01-15 ENCOUNTER — Telehealth: Payer: Self-pay | Admitting: General Practice

## 2023-01-15 NOTE — Transitions of Care (Post Inpatient/ED Visit) (Signed)
   01/15/2023  Name: Alyssa Sparks MRN: [id number] DOB: 07/08/1991  Today's TOC FU Call Status: Today's TOC FU Call Status:: Unsuccessul Call (1st Attempt) Unsuccessful Call (1st Attempt) Date: 01/15/23  Attempted to reach the patient regarding the most recent Inpatient/ED visit.  Follow Up Plan: Additional outreach attempts will be made to reach the patient to complete the Transitions of Care (Post Inpatient/ED visit) call.   Signature Modesto Charon, RN BSN

## 2023-01-16 ENCOUNTER — Ambulatory Visit (INDEPENDENT_AMBULATORY_CARE_PROVIDER_SITE_OTHER): Payer: Managed Care, Other (non HMO)

## 2023-01-16 VITALS — BP 108/67 | HR 95 | Wt 216.0 lb

## 2023-01-16 DIAGNOSIS — Z3482 Encounter for supervision of other normal pregnancy, second trimester: Secondary | ICD-10-CM | POA: Diagnosis not present

## 2023-01-16 DIAGNOSIS — Z3A18 18 weeks gestation of pregnancy: Secondary | ICD-10-CM

## 2023-01-16 NOTE — Progress Notes (Signed)
   PRENATAL VISIT NOTE  Subjective:  Alyssa Sparks is a 32 y.o. G2P1001 at [redacted]w[redacted]d being seen today for ongoing prenatal care.  She is currently monitored for the following issues for this low-risk pregnancy and has PCOS (polycystic ovarian syndrome); Macromastia; Chronic bilateral low back pain without sciatica; BMI 35.0-35.9,adult; Acute stress reaction; Supervision of normal pregnancy; History of gestational diabetes mellitus (GDM); and History of shoulder dystocia in prior pregnancy on their problem list.  Patient reports no complaints.  Contractions: Not present. Vag. Bleeding: None.  Movement: Absent. Denies leaking of fluid.   The following portions of the patient's history were reviewed and updated as appropriate: allergies, current medications, past family history, past medical history, past social history, past surgical history and problem list.   Objective:   Vitals:   01/16/23 1003  BP: 108/67  Pulse: 95  Weight: 216 lb (98 kg)    Fetal Status: Fetal Heart Rate (bpm): 150   Movement: Absent     General:  Alert, oriented and cooperative. Patient is in no acute distress.  Skin: Skin is warm and dry. No rash noted.   Cardiovascular: Normal heart rate noted  Respiratory: Normal respiratory effort, no problems with respiration noted  Abdomen: Soft, gravid, appropriate for gestational age.  Pain/Pressure: Absent     Pelvic: Cervical exam deferred        Extremities: Normal range of motion.  Edema: Trace  Mental Status: Normal mood and affect. Normal behavior. Normal judgment and thought content.   Assessment and Plan:  Pregnancy: G2P1001 at [redacted]w[redacted]d 1. Encounter for supervision of other normal pregnancy in second trimester - Routine OB. Doing well, no concerns - Declines AFP. Anatomy ultrasound scheduled next week - Anticipatory guidance for upcoming appointments provided  2. [redacted] weeks gestation of pregnancy - Starting to feel "flutters"  Preterm labor symptoms and general  obstetric precautions including but not limited to vaginal bleeding, contractions, leaking of fluid and fetal movement were reviewed in detail with the patient. Please refer to After Visit Summary for other counseling recommendations.   Return in about 4 weeks (around 02/13/2023) for ROB.  Future Appointments  Date Time Provider Department Center  01/25/2023 12:45 PM [facility] NURSE [facility]  01/25/2023  1:00 PM [facility] US1 [facility]  02/14/2023  9:50 AM Sue Lush, FNP [facility]    Brand Males, CNM

## 2023-01-16 NOTE — Transitions of Care (Post Inpatient/ED Visit) (Signed)
   01/16/2023  Name: Alyssa Sparks MRN: [id number] DOB: [birth year]  Today's TOC FU Call Status: Today's TOC FU Call Status:: Unsuccessful Call (2nd Attempt) Unsuccessful Call (1st Attempt) Date: 01/15/23 Unsuccessful Call (2nd Attempt) Date: 01/16/23  Attempted to reach the patient regarding the most recent Inpatient/ED visit.  Follow Up Plan: Additional outreach attempts will be made to reach the patient to complete the Transitions of Care (Post Inpatient/ED visit) call.   Signature Modesto Charon, RN BSN

## 2023-01-17 NOTE — Transitions of Care (Post Inpatient/ED Visit) (Signed)
   01/17/2023  Name: Alyssa Sparks MRN: [id number] DOB: 1990-10-17  Today's TOC FU Call Status: Today's TOC FU Call Status:: Unsuccessful Call (3rd Attempt) Unsuccessful Call (1st Attempt) Date: 01/15/23 Unsuccessful Call (2nd Attempt) Date: 01/16/23 Unsuccessful Call (3rd Attempt) Date: 01/17/23  Attempted to reach the patient regarding the most recent Inpatient/ED visit.  Follow Up Plan: No further outreach attempts will be made at this time. We have been unable to contact the patient.  Signature Modesto Charon, RN BSN

## 2023-01-24 ENCOUNTER — Encounter: Payer: Self-pay | Admitting: *Deleted

## 2023-01-25 ENCOUNTER — Ambulatory Visit: Payer: Managed Care, Other (non HMO) | Admitting: *Deleted

## 2023-01-25 ENCOUNTER — Ambulatory Visit: Payer: Managed Care, Other (non HMO) | Attending: Obstetrics and Gynecology

## 2023-01-25 ENCOUNTER — Encounter: Payer: Self-pay | Admitting: *Deleted

## 2023-01-25 VITALS — BP 114/61 | HR 85

## 2023-01-25 DIAGNOSIS — O09292 Supervision of pregnancy with other poor reproductive or obstetric history, second trimester: Secondary | ICD-10-CM | POA: Diagnosis not present

## 2023-01-25 DIAGNOSIS — O99212 Obesity complicating pregnancy, second trimester: Secondary | ICD-10-CM | POA: Insufficient documentation

## 2023-01-25 DIAGNOSIS — O09212 Supervision of pregnancy with history of pre-term labor, second trimester: Secondary | ICD-10-CM | POA: Insufficient documentation

## 2023-01-25 DIAGNOSIS — Z6834 Body mass index (BMI) 34.0-34.9, adult: Secondary | ICD-10-CM

## 2023-01-25 DIAGNOSIS — Z3A19 19 weeks gestation of pregnancy: Secondary | ICD-10-CM | POA: Diagnosis not present

## 2023-01-25 DIAGNOSIS — Z363 Encounter for antenatal screening for malformations: Secondary | ICD-10-CM | POA: Insufficient documentation

## 2023-01-25 DIAGNOSIS — Z3482 Encounter for supervision of other normal pregnancy, second trimester: Secondary | ICD-10-CM | POA: Diagnosis not present

## 2023-02-01 ENCOUNTER — Other Ambulatory Visit: Payer: Self-pay | Admitting: *Deleted

## 2023-02-01 DIAGNOSIS — Z362 Encounter for other antenatal screening follow-up: Secondary | ICD-10-CM

## 2023-02-14 ENCOUNTER — Encounter: Payer: Managed Care, Other (non HMO) | Admitting: Obstetrics and Gynecology

## 2023-03-01 ENCOUNTER — Other Ambulatory Visit: Payer: Self-pay | Admitting: *Deleted

## 2023-03-01 ENCOUNTER — Ambulatory Visit: Payer: Managed Care, Other (non HMO) | Attending: Maternal & Fetal Medicine

## 2023-03-01 ENCOUNTER — Ambulatory Visit: Payer: Managed Care, Other (non HMO) | Admitting: *Deleted

## 2023-03-01 DIAGNOSIS — O99212 Obesity complicating pregnancy, second trimester: Secondary | ICD-10-CM

## 2023-03-01 DIAGNOSIS — E669 Obesity, unspecified: Secondary | ICD-10-CM | POA: Diagnosis not present

## 2023-03-01 DIAGNOSIS — Z8632 Personal history of gestational diabetes: Secondary | ICD-10-CM

## 2023-03-01 DIAGNOSIS — Z3A24 24 weeks gestation of pregnancy: Secondary | ICD-10-CM

## 2023-03-01 DIAGNOSIS — O09292 Supervision of pregnancy with other poor reproductive or obstetric history, second trimester: Secondary | ICD-10-CM

## 2023-03-01 DIAGNOSIS — Z362 Encounter for other antenatal screening follow-up: Secondary | ICD-10-CM | POA: Diagnosis present

## 2023-04-05 ENCOUNTER — Ambulatory Visit: Payer: Managed Care, Other (non HMO) | Attending: Obstetrics

## 2023-04-05 ENCOUNTER — Ambulatory Visit: Payer: Managed Care, Other (non HMO)

## 2023-04-05 ENCOUNTER — Other Ambulatory Visit: Payer: Self-pay | Admitting: *Deleted

## 2023-04-05 VITALS — BP 110/64 | HR 94

## 2023-04-05 DIAGNOSIS — O09293 Supervision of pregnancy with other poor reproductive or obstetric history, third trimester: Secondary | ICD-10-CM | POA: Diagnosis not present

## 2023-04-05 DIAGNOSIS — O99213 Obesity complicating pregnancy, third trimester: Secondary | ICD-10-CM

## 2023-04-05 DIAGNOSIS — Z8632 Personal history of gestational diabetes: Secondary | ICD-10-CM | POA: Diagnosis present

## 2023-04-05 DIAGNOSIS — E669 Obesity, unspecified: Secondary | ICD-10-CM | POA: Diagnosis not present

## 2023-04-05 DIAGNOSIS — O09213 Supervision of pregnancy with history of pre-term labor, third trimester: Secondary | ICD-10-CM | POA: Diagnosis not present

## 2023-04-05 DIAGNOSIS — Z3A29 29 weeks gestation of pregnancy: Secondary | ICD-10-CM

## 2023-04-05 DIAGNOSIS — O09299 Supervision of pregnancy with other poor reproductive or obstetric history, unspecified trimester: Secondary | ICD-10-CM

## 2023-04-05 DIAGNOSIS — O99212 Obesity complicating pregnancy, second trimester: Secondary | ICD-10-CM | POA: Diagnosis present

## 2023-05-22 LAB — OB RESULTS CONSOLE GBS: GBS: NEGATIVE

## 2023-05-31 ENCOUNTER — Ambulatory Visit: Payer: Managed Care, Other (non HMO) | Attending: Obstetrics and Gynecology

## 2023-05-31 DIAGNOSIS — Z3A37 37 weeks gestation of pregnancy: Secondary | ICD-10-CM

## 2023-05-31 DIAGNOSIS — O09299 Supervision of pregnancy with other poor reproductive or obstetric history, unspecified trimester: Secondary | ICD-10-CM | POA: Diagnosis present

## 2023-05-31 DIAGNOSIS — O99213 Obesity complicating pregnancy, third trimester: Secondary | ICD-10-CM | POA: Insufficient documentation

## 2023-05-31 DIAGNOSIS — E669 Obesity, unspecified: Secondary | ICD-10-CM | POA: Diagnosis not present

## 2023-05-31 DIAGNOSIS — O09213 Supervision of pregnancy with history of pre-term labor, third trimester: Secondary | ICD-10-CM | POA: Diagnosis not present

## 2023-05-31 DIAGNOSIS — O09293 Supervision of pregnancy with other poor reproductive or obstetric history, third trimester: Secondary | ICD-10-CM | POA: Diagnosis not present

## 2023-06-06 ENCOUNTER — Other Ambulatory Visit: Payer: Self-pay | Admitting: Obstetrics and Gynecology

## 2023-06-06 ENCOUNTER — Encounter (HOSPITAL_COMMUNITY): Payer: Self-pay | Admitting: *Deleted

## 2023-06-06 ENCOUNTER — Telehealth (HOSPITAL_COMMUNITY): Payer: Self-pay | Admitting: *Deleted

## 2023-06-06 NOTE — Telephone Encounter (Signed)
Preadmission screen  

## 2023-06-12 ENCOUNTER — Encounter (HOSPITAL_COMMUNITY): Payer: Self-pay | Admitting: Obstetrics and Gynecology

## 2023-06-12 ENCOUNTER — Other Ambulatory Visit: Payer: Self-pay

## 2023-06-12 ENCOUNTER — Inpatient Hospital Stay (HOSPITAL_COMMUNITY): Payer: Managed Care, Other (non HMO) | Admitting: Anesthesiology

## 2023-06-12 ENCOUNTER — Inpatient Hospital Stay (HOSPITAL_COMMUNITY)
Admission: AD | Admit: 2023-06-12 | Discharge: 2023-06-14 | DRG: 807 | Disposition: A | Discharge status: Home / Self Care | Payer: Managed Care, Other (non HMO) | Attending: Obstetrics and Gynecology | Admitting: Obstetrics and Gynecology

## 2023-06-12 ENCOUNTER — Inpatient Hospital Stay (HOSPITAL_COMMUNITY): Payer: Managed Care, Other (non HMO)

## 2023-06-12 DIAGNOSIS — O9902 Anemia complicating childbirth: Secondary | ICD-10-CM | POA: Diagnosis present

## 2023-06-12 DIAGNOSIS — Z833 Family history of diabetes mellitus: Secondary | ICD-10-CM

## 2023-06-12 DIAGNOSIS — Z3A39 39 weeks gestation of pregnancy: Secondary | ICD-10-CM | POA: Diagnosis not present

## 2023-06-12 DIAGNOSIS — Z825 Family history of asthma and other chronic lower respiratory diseases: Secondary | ICD-10-CM | POA: Diagnosis not present

## 2023-06-12 DIAGNOSIS — O99019 Anemia complicating pregnancy, unspecified trimester: Secondary | ICD-10-CM | POA: Diagnosis present

## 2023-06-12 DIAGNOSIS — Z8249 Family history of ischemic heart disease and other diseases of the circulatory system: Secondary | ICD-10-CM

## 2023-06-12 DIAGNOSIS — O26893 Other specified pregnancy related conditions, third trimester: Secondary | ICD-10-CM | POA: Diagnosis present

## 2023-06-12 DIAGNOSIS — Z8632 Personal history of gestational diabetes: Secondary | ICD-10-CM | POA: Diagnosis not present

## 2023-06-12 DIAGNOSIS — O09299 Supervision of pregnancy with other poor reproductive or obstetric history, unspecified trimester: Secondary | ICD-10-CM

## 2023-06-12 DIAGNOSIS — O3663X0 Maternal care for excessive fetal growth, third trimester, not applicable or unspecified: Principal | ICD-10-CM | POA: Diagnosis present

## 2023-06-12 DIAGNOSIS — O3660X0 Maternal care for excessive fetal growth, unspecified trimester, not applicable or unspecified: Principal | ICD-10-CM | POA: Diagnosis present

## 2023-06-12 LAB — CBC
HCT: 28.6 % — ABNORMAL LOW (ref 36.0–46.0)
Hemoglobin: 8.9 g/dL — ABNORMAL LOW (ref 12.0–15.0)
MCH: 28.5 pg (ref 26.0–34.0)
MCHC: 31.1 g/dL (ref 30.0–36.0)
MCV: 91.7 fL (ref 80.0–100.0)
Platelets: 256 10*3/uL (ref 150–400)
RBC: 3.12 MIL/uL — ABNORMAL LOW (ref 3.87–5.11)
RDW: 13.9 % (ref 11.5–15.5)
WBC: 6.1 10*3/uL (ref 4.0–10.5)
nRBC: 0.3 % — ABNORMAL HIGH (ref 0.0–0.2)

## 2023-06-12 LAB — COMPREHENSIVE METABOLIC PANEL
ALT: 8 U/L (ref 0–44)
AST: 16 U/L (ref 15–41)
Albumin: 2.5 g/dL — ABNORMAL LOW (ref 3.5–5.0)
Alkaline Phosphatase: 180 U/L — ABNORMAL HIGH (ref 38–126)
Anion gap: 4 — ABNORMAL LOW (ref 5–15)
BUN: <5 mg/dL — ABNORMAL LOW (ref 6–20)
CO2: 21 mmol/L — ABNORMAL LOW (ref 22–32)
Calcium: 8.2 mg/dL — ABNORMAL LOW (ref 8.9–10.3)
Chloride: 112 mmol/L — ABNORMAL HIGH (ref 98–111)
Creatinine, Ser: 0.6 mg/dL (ref 0.44–1.00)
GFR, Estimated: >60 mL/min (ref >60)
Glucose, Bld: 88 mg/dL (ref 70–99)
Potassium: 3.2 mmol/L — ABNORMAL LOW (ref 3.5–5.1)
Sodium: 137 mmol/L (ref 135–145)
Total Bilirubin: 0.9 mg/dL (ref 0.3–1.2)
Total Protein: 6 g/dL — ABNORMAL LOW (ref 6.5–8.1)

## 2023-06-12 LAB — RPR: RPR Ser Ql: NONREACTIVE

## 2023-06-12 LAB — PROTEIN / CREATININE RATIO, URINE
Creatinine, Urine: 76 mg/dL
Protein Creatinine Ratio: 0.16 mg/mg{creat} — ABNORMAL HIGH (ref 0.00–0.15)
Total Protein, Urine: 12 mg/dL

## 2023-06-12 LAB — TYPE AND SCREEN
ABO/RH(D): O POS
Antibody Screen: NEGATIVE

## 2023-06-12 MED ORDER — LIDOCAINE HCL (PF) 1 % IJ SOLN: 30.0000 mL | INTRAMUSCULAR | Status: DC | PRN

## 2023-06-12 MED ORDER — EPHEDRINE 5 MG/ML INJ: 10.0000 mg | INTRAVENOUS | Status: DC | PRN

## 2023-06-12 MED ORDER — ACETAMINOPHEN 325 MG PO TABS: 650.0000 mg | ORAL_TABLET | ORAL | Status: DC | PRN

## 2023-06-12 MED ORDER — PHENYLEPHRINE 80 MCG/ML (10ML) SYRINGE FOR IV PUSH (FOR BLOOD PRESSURE SUPPORT): 80.0000 ug | PREFILLED_SYRINGE | INTRAVENOUS | Status: DC | PRN

## 2023-06-12 MED ORDER — LACTATED RINGERS IV SOLN
500.0000 mL | INTRAVENOUS | Status: DC | PRN
  Administered 2023-06-12: 500 mL via INTRAVENOUS

## 2023-06-12 MED ORDER — LACTATED RINGERS IV SOLN
500.0000 mL | Freq: Once | INTRAVENOUS | Status: AC
  Administered 2023-06-12: 500 mL via INTRAVENOUS

## 2023-06-12 MED ORDER — FLEET ENEMA RE ENEM: 1.0000 | ENEMA | RECTAL | Status: DC | PRN

## 2023-06-12 MED ORDER — OXYTOCIN-SODIUM CHLORIDE 30-0.9 UT/500ML-% IV SOLN: 1.0000 m[IU]/min | INTRAVENOUS | Status: DC

## 2023-06-12 MED ORDER — OXYTOCIN BOLUS FROM INFUSION
333.0000 mL | Freq: Once | INTRAVENOUS | Status: AC
  Administered 2023-06-13: 333 mL via INTRAVENOUS

## 2023-06-12 MED ORDER — LACTATED RINGERS AMNIOINFUSION: INTRAVENOUS | Status: DC

## 2023-06-12 MED ORDER — DIPHENHYDRAMINE HCL 50 MG/ML IJ SOLN: 12.5000 mg | INTRAMUSCULAR | Status: DC | PRN

## 2023-06-12 MED ORDER — SOD CITRATE-CITRIC ACID 500-334 MG/5ML PO SOLN: 30.0000 mL | ORAL | Status: DC | PRN

## 2023-06-12 MED ORDER — FENTANYL-BUPIVACAINE-NACL 0.5-0.125-0.9 MG/250ML-% EP SOLN
12.0000 mL/h | EPIDURAL | Status: DC | PRN
  Administered 2023-06-12: 12 mL/h via EPIDURAL
  Filled 2023-06-12: qty 250

## 2023-06-12 MED ORDER — OXYTOCIN-SODIUM CHLORIDE 30-0.9 UT/500ML-% IV SOLN
2.5000 [IU]/h | INTRAVENOUS | Status: DC
  Filled 2023-06-12: qty 500

## 2023-06-12 MED ORDER — ONDANSETRON HCL 4 MG/2ML IJ SOLN
4.0000 mg | Freq: Four times a day (QID) | INTRAMUSCULAR | Status: DC | PRN
  Administered 2023-06-12: 4 mg via INTRAVENOUS
  Filled 2023-06-12: qty 2

## 2023-06-12 MED ORDER — MISOPROSTOL 25 MCG QUARTER TABLET
25.0000 ug | ORAL_TABLET | Freq: Once | ORAL | Status: AC
  Administered 2023-06-12: 25 ug via VAGINAL
  Filled 2023-06-12: qty 1

## 2023-06-12 MED ORDER — TERBUTALINE SULFATE 1 MG/ML IJ SOLN: 0.2500 mg | Freq: Once | INTRAMUSCULAR | Status: DC | PRN

## 2023-06-12 MED ORDER — LACTATED RINGERS IV SOLN: INTRAVENOUS | Status: DC

## 2023-06-12 MED ORDER — LACTATED RINGERS IV SOLN: 500.0000 mL | Freq: Once | INTRAVENOUS | Status: DC

## 2023-06-12 MED ORDER — SODIUM BICARBONATE 8.4 % IV SOLN
INTRAVENOUS | Status: DC | PRN
  Administered 2023-06-12: 5 mL via EPIDURAL
  Administered 2023-06-12: 3 mL via EPIDURAL
  Administered 2023-06-12: 2 mL via EPIDURAL

## 2023-06-12 MED ORDER — OXYTOCIN-SODIUM CHLORIDE 30-0.9 UT/500ML-% IV SOLN
1.0000 m[IU]/min | INTRAVENOUS | Status: DC
  Administered 2023-06-12: 1 m[IU]/min via INTRAVENOUS

## 2023-06-12 NOTE — H&P (Addendum)
Alyssa Sparks is a 32 y.o. female presenting for IOL. Pt with H/o shoulder dystocia that resulted in a fetal clavicular fracture.  Good FM, no vb. OB History     Gravida  2   Para  1   Term  1   Preterm      AB      Living  1      SAB      IAB      Ectopic      Multiple  0   Live Births  1          Past Medical History:  Diagnosis Date   Acute stress reaction 11/01/2019   Anxiety    Broken arm    RIGHT   Chronic bilateral low back pain without sciatica 06/02/2016   Gestational diabetes    History of shoulder dystocia in prior pregnancy 11/22/2022   G1  Delivery note reviewed - resolved with McRoberts & suprapubic  Infant weight 3800g, had GDM w/ pregnancy  Infant dx w/ clavicle fracture after delivery   Infertility, female    Macromastia 06/02/2016   PCOS (polycystic ovarian syndrome) 10/14/2015   Polycystic ovarian disease    Past Surgical History:  Procedure Laterality Date   NO PAST SURGERIES     Family History: family history includes Breast cancer in an other family member; COPD in her maternal grandfather; Cancer in her maternal grandfather and maternal grandmother; Deep vein thrombosis in her maternal grandmother; Diabetes in her maternal grandfather, maternal grandmother, and mother; Heart attack in her father; Hypertension in her maternal grandfather, maternal grandmother, and mother; Prostate cancer in her maternal grandfather. Social History:  reports that she has never smoked. She has never used smokeless tobacco. She reports that she does not currently use alcohol. She reports that she does not use drugs.     Maternal Diabetes: No Genetic Screening: Normal Maternal Ultrasounds/Referrals: Normal Fetal Ultrasounds or other Referrals:  None Maternal Substance Abuse:  No Significant Maternal Medications:  None Significant Maternal Lab Results:  Group B Strep negative Number of Prenatal Visits:greater than 3 verified prenatal visits Other  Comments:  None  Review of Systems History Dilation: 3 Effacement (%): 50 Station: Ballotable Exam by:: Dr. Normand Sloop Blood pressure 136/85, pulse 78, temperature 98.1 F (36.7 C), temperature source Oral, resp. rate 16, height 5\' 7"  (1.702 m), weight 108.9 kg, last menstrual period 09/12/2022, SpO2 97%, not currently breastfeeding. Exam Physical Exam  Physical Examination: General appearance - alert, well appearing, and in no distress Chest - clear to auscultation, no wheezes, rales or rhonchi, symmetric air entry Heart - normal rate and regular rhythm Abdomen - soft, nontender, nondistended, no masses or organomegaly gravid Extremities - peripheral pulses normal, no pedal edema, no clubbing or cyanosis, Homan's sign negative bilaterally  Prenatal labs: ABO, Rh: --/--/O POS (09/17 1610) Antibody: NEG (09/17 0618) Rubella: 1.86 (02/28 1049) RPR: NON REACTIVE (09/17 9604)  HBsAg: Negative (02/28 1049)  HIV: Non Reactive (02/28 1049)  GBS: Negative/-- (08/27 0000)   Assessment/Plan: IUP at 39 weeks EFW 8-10 Risks of CS and SVD with possible dystocia reviewed She desires to proceed S/p one cytotec Epidural for pain Tdap 8/24 GHTN no meds needed. Labs WNL Cat 1 fetal tracing   Alyssa Sparks A Alyssa Sparks 06/12/2023, 12:54 PM

## 2023-06-12 NOTE — Anesthesia Preprocedure Evaluation (Signed)
Anesthesia Evaluation  Patient identified by MRN, date of birth, ID band Patient awake    Reviewed: Allergy & Precautions, Patient's Chart, lab work & pertinent test results  Airway Mallampati: II  TM Distance: >3 FB     Dental   Pulmonary neg pulmonary ROS   Pulmonary exam normal        Cardiovascular negative cardio ROS Normal cardiovascular exam     Neuro/Psych negative neurological ROS     GI/Hepatic negative GI ROS, Neg liver ROS,,,  Endo/Other  diabetes, Gestational    Renal/GU negative Renal ROS     Musculoskeletal   Abdominal   Peds  Hematology  (+) Blood dyscrasia, anemia   Anesthesia Other Findings   Reproductive/Obstetrics (+) Pregnancy                             Anesthesia Physical Anesthesia Plan  ASA: 2  Anesthesia Plan: Epidural   Post-op Pain Management:    Induction:   PONV Risk Score and Plan: 2  Airway Management Planned: Natural Airway  Additional Equipment:   Intra-op Plan:   Post-operative Plan:   Informed Consent: I have reviewed the patients History and Physical, chart, labs and discussed the procedure including the risks, benefits and alternatives for the proposed anesthesia with the patient or authorized representative who has indicated his/her understanding and acceptance.       Plan Discussed with:   Anesthesia Plan Comments:        Anesthesia Quick Evaluation

## 2023-06-12 NOTE — Progress Notes (Signed)
Alyssa Sparks is a 32 y.o. G2P1001 at [redacted]w[redacted]d admitted for induction of labor due to [facility] and h/o shoulder dystocia.  Subjective: Called to bedside as patient having recurrent variable deceleration since SROM. Intervention performed and FHT improved. FSE/IUPC placed w/o difficulty and clear fluid flash back noted with IUPC. Noted rapid cervical change from 5/70/-3 to 9/90/0.   Objective: BP 132/73   Pulse 66   Temp 98.1 F (36.7 C) (Oral)   Resp 16   Ht 5\' 7"  (1.702 m)   Wt 108.9 kg   LMP 09/12/2022   SpO2 97%   BMI 37.59 kg/m  No intake/output data recorded. Total I/O In: -  Out: 850 [Urine:850]  FHT:  FHR: 150 bpm, variability: moderate,  accelerations:  Abscent,  decelerations:  Present variable UC:   q2-3 minutes SVE:   Dilation: 9 Effacement (%): 90 Station: 0 Exam by:: Allanah Mcfarland MD  Labs: Lab Results  Component Value Date   WBC 6.1 06/12/2023   HGB 8.9 (L) 06/12/2023   HCT 28.6 (L) 06/12/2023   MCV 91.7 06/12/2023   PLT 256 06/12/2023    Assessment / Plan: Induction of labor due to LGA w/o h/o SD,  progressing well on pitocin, pt. desires vaginal delivery after counseled of risk and c-section offered  Labor:  cytotec x1 dose, pitocin, s/p SROM ,  Preeclampsia:  no signs or symptoms of toxicity Fetal Wellbeing:  Category II, improved with interventions and amnioinfusion going Pain Control:  Epidural I/D:  n/a Anticipated MOD:  NSVD  Jackie Plum, MD 06/12/2023, 11:46 PM

## 2023-06-12 NOTE — Anesthesia Procedure Notes (Signed)
Epidural Patient location during procedure: OB Start time: 06/12/2023 11:05 AM End time: 06/12/2023 11:11 AM  Staffing Anesthesiologist: Marcene Duos, MD Performed: anesthesiologist   Preanesthetic Checklist Completed: patient identified, IV checked, site marked, risks and benefits discussed, surgical consent, monitors and equipment checked, pre-op evaluation and timeout performed  Epidural Patient position: sitting Prep: DuraPrep and site prepped and draped Patient monitoring: continuous pulse ox and blood pressure Approach: midline Location: L4-L5 Injection technique: LOR air  Needle:  Needle type: Tuohy  Needle gauge: 17 G Needle length: 9 cm and 9 Needle insertion depth: 8 cm Catheter type: closed end flexible Catheter size: 19 Gauge Catheter at skin depth: 16 cm Test dose: negative  Assessment Events: blood not aspirated, no cerebrospinal fluid, injection not painful, no injection resistance, no paresthesia and negative IV test

## 2023-06-13 ENCOUNTER — Encounter (HOSPITAL_COMMUNITY): Payer: Self-pay | Admitting: Obstetrics and Gynecology

## 2023-06-13 LAB — COMPREHENSIVE METABOLIC PANEL
ALT: 6 U/L (ref 0–44)
AST: 18 U/L (ref 15–41)
Albumin: 2.2 g/dL — ABNORMAL LOW (ref 3.5–5.0)
Alkaline Phosphatase: 162 U/L — ABNORMAL HIGH (ref 38–126)
Anion gap: 8 (ref 5–15)
BUN: <5 mg/dL — ABNORMAL LOW (ref 6–20)
CO2: 20 mmol/L — ABNORMAL LOW (ref 22–32)
Calcium: 8 mg/dL — ABNORMAL LOW (ref 8.9–10.3)
Chloride: 110 mmol/L (ref 98–111)
Creatinine, Ser: 0.75 mg/dL (ref 0.44–1.00)
GFR, Estimated: >60 mL/min (ref >60)
Glucose, Bld: 99 mg/dL (ref 70–99)
Potassium: 3.1 mmol/L — ABNORMAL LOW (ref 3.5–5.1)
Sodium: 138 mmol/L (ref 135–145)
Total Bilirubin: 0.7 mg/dL (ref 0.3–1.2)
Total Protein: 5.1 g/dL — ABNORMAL LOW (ref 6.5–8.1)

## 2023-06-13 LAB — CBC
HCT: 29.3 % — ABNORMAL LOW (ref 36.0–46.0)
Hemoglobin: 8.9 g/dL — ABNORMAL LOW (ref 12.0–15.0)
MCH: 27.6 pg (ref 26.0–34.0)
MCHC: 30.4 g/dL (ref 30.0–36.0)
MCV: 91 fL (ref 80.0–100.0)
Platelets: 228 10*3/uL (ref 150–400)
RBC: 3.22 MIL/uL — ABNORMAL LOW (ref 3.87–5.11)
RDW: 14 % (ref 11.5–15.5)
WBC: 10.7 10*3/uL — ABNORMAL HIGH (ref 4.0–10.5)
nRBC: 0 % (ref 0.0–0.2)

## 2023-06-13 LAB — URIC ACID: Uric Acid, Serum: 4.3 mg/dL (ref 2.5–7.1)

## 2023-06-13 LAB — LACTATE DEHYDROGENASE: LDH: 188 U/L (ref 98–192)

## 2023-06-13 MED ORDER — PRENATAL MULTIVITAMIN CH
1.0000 | ORAL_TABLET | Freq: Every day | ORAL | Status: DC
  Administered 2023-06-13 – 2023-06-14 (×2): 1 via ORAL
  Filled 2023-06-13 (×2): qty 1

## 2023-06-13 MED ORDER — DIPHENHYDRAMINE HCL 25 MG PO CAPS: 25.0000 mg | ORAL_CAPSULE | Freq: Four times a day (QID) | ORAL | Status: DC | PRN

## 2023-06-13 MED ORDER — MISOPROSTOL 200 MCG PO TABS
800.0000 ug | ORAL_TABLET | Freq: Once | ORAL | Status: AC
  Administered 2023-06-13: 800 ug via RECTAL

## 2023-06-13 MED ORDER — BENZOCAINE-MENTHOL 20-0.5 % EX AERO
1.0000 | INHALATION_SPRAY | CUTANEOUS | Status: DC | PRN
  Filled 2023-06-13: qty 56

## 2023-06-13 MED ORDER — ONDANSETRON HCL 4 MG/2ML IJ SOLN: 4.0000 mg | INTRAMUSCULAR | Status: DC | PRN

## 2023-06-13 MED ORDER — ACETAMINOPHEN 325 MG PO TABS: 650.0000 mg | ORAL_TABLET | ORAL | Status: DC | PRN

## 2023-06-13 MED ORDER — IBUPROFEN 600 MG PO TABS
600.0000 mg | ORAL_TABLET | Freq: Four times a day (QID) | ORAL | Status: DC
  Administered 2023-06-13 – 2023-06-14 (×6): 600 mg via ORAL
  Filled 2023-06-13 (×7): qty 1

## 2023-06-13 MED ORDER — DIBUCAINE (PERIANAL) 1 % EX OINT: 1.0000 | TOPICAL_OINTMENT | CUTANEOUS | Status: DC | PRN

## 2023-06-13 MED ORDER — ZOLPIDEM TARTRATE 5 MG PO TABS: 5.0000 mg | ORAL_TABLET | Freq: Every evening | ORAL | Status: DC | PRN

## 2023-06-13 MED ORDER — ONDANSETRON HCL 4 MG PO TABS: 4.0000 mg | ORAL_TABLET | ORAL | Status: DC | PRN

## 2023-06-13 MED ORDER — SENNOSIDES-DOCUSATE SODIUM 8.6-50 MG PO TABS
2.0000 | ORAL_TABLET | Freq: Every day | ORAL | Status: DC
  Administered 2023-06-13 – 2023-06-14 (×2): 2 via ORAL
  Filled 2023-06-13 (×2): qty 2

## 2023-06-13 MED ORDER — OXYCODONE HCL 5 MG PO TABS: 10.0000 mg | ORAL_TABLET | ORAL | Status: DC | PRN

## 2023-06-13 MED ORDER — SIMETHICONE 80 MG PO CHEW: 80.0000 mg | CHEWABLE_TABLET | ORAL | Status: DC | PRN

## 2023-06-13 MED ORDER — COCONUT OIL OIL: 1.0000 | TOPICAL_OIL | Status: DC | PRN

## 2023-06-13 MED ORDER — MISOPROSTOL 200 MCG PO TABS
ORAL_TABLET | ORAL | Status: AC
  Filled 2023-06-13: qty 4

## 2023-06-13 MED ORDER — WITCH HAZEL-GLYCERIN EX PADS: 1.0000 | MEDICATED_PAD | CUTANEOUS | Status: DC | PRN

## 2023-06-13 MED ORDER — OXYCODONE HCL 5 MG PO TABS: 5.0000 mg | ORAL_TABLET | ORAL | Status: DC | PRN

## 2023-06-13 NOTE — Anesthesia Postprocedure Evaluation (Signed)
Anesthesia Post Note  Patient: Alyssa Sparks  Procedure(s) Performed: AN AD HOC LABOR EPIDURAL     Patient location during evaluation: OB High Risk Anesthesia Type: Epidural Level of consciousness: awake and alert Pain management: pain level controlled Vital Signs Assessment: post-procedure vital signs reviewed and stable Respiratory status: spontaneous breathing, nonlabored ventilation and respiratory function stable Cardiovascular status: stable Postop Assessment: no headache, no backache and epidural receding Anesthetic complications: no   No notable events documented.  Last Vitals:  Vitals:   06/13/23 0500 06/13/23 0844  BP: (!) 146/85 114/78  Pulse: 79 80  Resp: 18 18  Temp: 36.9 C   SpO2: 100% 99%    Last Pain:  Vitals:   06/13/23 0844  TempSrc:   PainSc: 4    Pain Goal:                Epidural/Spinal Function Cutaneous sensation: Normal sensation (06/13/23 0844)  Marrion Coy

## 2023-06-13 NOTE — Progress Notes (Signed)
Went into patient room to do 1 hr check.  Patient stated that she felt a large amount of pressure like she either needed to urinate or have a bowel movement.  Noted that u catheter had a large clot in line, not actively draining currently.  Got patient to toliet via steady (patient walked back to bed from the bathroom).  Patient urinated large amount into toliet while u catheter still in place.  (Patient was leaking urine while she was transitioning from steady to toliet).  After patient urinated, u catheter started flowing again.  Called Dr Hester Mates, explained what was happening currently, she said to remove foley u catheter (patient also stated she could feel the balloon on the tip of the foley and there was a large amount of u catheter outside of her body.  Pulled foley u catheter, put hat in toliet, per Dr Hester Mates follow routine protocol post foley removal.  Patient stated post urination/removal of u cath that her pressured lessened.

## 2023-06-14 DIAGNOSIS — O99019 Anemia complicating pregnancy, unspecified trimester: Secondary | ICD-10-CM | POA: Diagnosis present

## 2023-06-14 MED ORDER — IBUPROFEN 600 MG PO TABS: 600.0000 mg | ORAL_TABLET | Freq: Four times a day (QID) | ORAL | 0 refills | Status: DC

## 2023-06-14 NOTE — Plan of Care (Signed)
Adequate for discharge.

## 2023-06-14 NOTE — Discharge Summary (Signed)
VAVB OB Discharge Summary       Patient Name: Alyssa Sparks DOB: 05-May-1991 MRN: [id number]  Date of admission: 06/12/2023 Delivering MD: Reesa Chew D Date of delivery: 06/13/2023 Type of delivery: VAVB with shoulder dystocia  Newborn Data: Sex: Baby female Live born female  Birth Weight: 9 lb 2.4 oz (4150 g) APGAR: 8, 9  Newborn Delivery   Time head delivered: 06/13/2023 01:45:00 Birth date/time: 06/13/2023 01:45:43 Delivery type: Vaginal, Vacuum (Extractor)     Feeding: bottle Infant being discharge to home with mother in stable condition.   Admitting diagnosis: Large for gestational age fetus affecting management of mother [O36.60X0] Intrauterine pregnancy: [redacted]w[redacted]d     Secondary diagnosis:  Principal Problem:   Large for gestational age fetus affecting management of mother Active Problems:   H/O shoulder dystocia with result of fractured clavicle of infant in prior pregnancy, currently pregnant   Vacuum-assisted vaginal delivery   Shoulder dystocia during labor and delivery   Anemia affecting pregnancy   Normal postpartum course                                Complications: None                                                              Intrapartum Procedures: vacuum Postpartum Procedures: none Complications-Operative and Postpartum:  1st degree perineal laceration Augmentation: Pitocin and Cytotec   History of Present Illness: Alyssa Sparks is a 32 y.o. female, G2P2002, who presents at 110w1d weeks gestation. The patient has been followed at  [facility] and Gynecology  Her pregnancy has been complicated by:  Patient Active Problem List   Diagnosis Date Noted   Anemia affecting pregnancy 06/14/2023   Normal postpartum course 06/14/2023   Vacuum-assisted vaginal delivery 06/13/2023   Shoulder dystocia during labor and delivery 06/13/2023   Large for gestational age fetus affecting management of mother 06/12/2023   Supervision of  normal pregnancy 11/22/2022   History of gestational diabetes mellitus (GDM) 11/22/2022   H/O shoulder dystocia with result of fractured clavicle of infant in prior pregnancy, currently pregnant 11/22/2022   BMI 35.0-35.9,adult 06/23/2019     Active Ambulatory Problems    Diagnosis Date Noted   BMI 35.0-35.9,adult 06/23/2019   Supervision of normal pregnancy 11/22/2022   History of gestational diabetes mellitus (GDM) 11/22/2022   H/O shoulder dystocia with result of fractured clavicle of infant in prior pregnancy, currently pregnant 11/22/2022   Resolved Ambulatory Problems    Diagnosis Date Noted   Menometrorrhagia 01/24/2012   PCOS (polycystic ovarian syndrome) 10/14/2015   Pendulous breast 06/02/2016   Macromastia 06/02/2016   Chronic bilateral low back pain without sciatica 06/02/2016   Blurred vision, right eye 10/02/2016   Irregular periods 06/23/2019   Hirsutism 06/23/2019   Chlamydial infection 06/23/2019   Abdominal pain 06/23/2019   Acute stress reaction 11/01/2019   Acne 11/20/2019   Gestational diabetes mellitus (GDM), antepartum 08/17/2021   Encounter for induction of labor 10/18/2021   Past Medical History:  Diagnosis Date   Anxiety    Broken arm    Gestational diabetes    History of shoulder dystocia in prior pregnancy 11/22/2022   Infertility, female  Polycystic ovarian disease      Hospital course:  Induction of Labor With Vaginal Delivery   32 y.o. yo [id number] at [redacted]w[redacted]d was admitted to the hospital 06/12/2023 for induction of labor.  Indication for induction:  h/o SD and macrosomia .  Patient had an labor course complicated by was admitted on 9/17 and proceeded with IOL with SD and macrosomia, developed GHTN during labor course, pcr was 0.16, other lab unremarkable, h/o anxiety no meds, pt has vacuum with SD and newborn broken humerus, stable, over a 1st degree on 9/18 with ebl of with was not significant to stay, IDA with hgb of 8.9-8.9, on po iron at  home.  Membrane Rupture Time/Date: 9:40 PM,06/12/2023  Delivery Method:Vaginal, Vacuum (Extractor) Operative Delivery:Device used:kiwi Indication: Fetal indications Episiotomy: None Lacerations:  1st degree Details of delivery can be found in separate delivery note.  Patient had a postpartum course complicated bynone, BP was 126/79 and medication for ghtn indicated. Patient is discharged home 06/14/23.  Newborn Data: Birth date:06/13/2023 Birth time:1:45 AM Gender:Female Living status:Living Apgars:8 ,9  Weight:4150 g Postpartum Day # 1 : Patient up ad lib, denies syncope or dizziness. Reports consuming regular diet without issues and denies N/V. Patient reports 0 bowel movement + passing flatus.  Denies issues with urination and reports bleeding is "lighter."  Patient is bottlefeeding and reports going well.  Desires vasectomy for husband for postpartum contraception.  Pain is being appropriately managed with use of po meds.     Physical exam  Vitals:   06/13/23 1300 06/13/23 2003 06/13/23 2105 06/14/23 0520  BP: 132/81 133/86 138/85 126/79  Pulse: 78 79 75 71  Resp: 17 18 18 16   Temp: 98.7 F (37.1 C) 97.9 F (36.6 C)  98 F (36.7 C)  TempSrc: Oral Oral  Oral  SpO2: 99% 98%  98%  Weight:      Height:       General: alert, cooperative, and no distress Lochia: appropriate Uterine Fundus: firm Perineum: approximate DVT Evaluation: No evidence of DVT seen on physical exam. Negative Homan's sign. No cords or calf tenderness. No significant calf/ankle edema.  Labs: Lab Results  Component Value Date   WBC 10.7 (H) 06/13/2023   HGB 8.9 (L) 06/13/2023   HCT 29.3 (L) 06/13/2023   MCV 91.0 06/13/2023   PLT 228 06/13/2023      Latest Ref Rng & Units 06/13/2023    4:40 AM  CMP  Glucose 70 - 99 mg/dL 99   BUN 6 - 20 mg/dL <5   Creatinine 5.27 - 1.00 mg/dL 7.82   Sodium 423 - 536 mmol/L 138   Potassium 3.5 - 5.1 mmol/L 3.1   Chloride 98 - 111 mmol/L 110   CO2 22 - 32  mmol/L 20   Calcium 8.9 - 10.3 mg/dL 8.0   Total Protein 6.5 - 8.1 g/dL 5.1   Total Bilirubin 0.3 - 1.2 mg/dL 0.7   Alkaline Phos 38 - 126 U/L 162   AST 15 - 41 U/L 18   ALT 0 - 44 U/L 6     Date of discharge: 06/14/2023 Discharge Diagnoses: Term Pregnancy-delivered Discharge instruction: per After Visit Summary and "Baby and Me Booklet".  After visit meds:   Activity:           unrestricted and pelvic rest Advance as tolerated. Pelvic rest for 6 weeks.  Diet:                routine Medications: PNV, Ibuprofen,  Colace, and Iron Postpartum contraception: Vasectomy Condition:  Pt discharge to home with baby in stable condition  IDA: PO Iron  Meds: Allergies as of 06/14/2023   No Known Allergies      Medication List     STOP taking these medications    aspirin EC 81 MG tablet   ondansetron 8 MG tablet Commonly known as: ZOFRAN       TAKE these medications    FERROUS SULFATE PO Take by mouth.   ibuprofen 600 MG tablet Commonly known as: ADVIL Take 1 tablet (600 mg total) by mouth every 6 (six) hours.   PRENATAL VITAMIN PO Take by mouth.        Discharge Follow Up:   Follow-up Information     [facility] Follow up.   Specialty: Obstetrics and Gynecology Why: 1 weeks BP, 2 week mood check,  check and 6 weeks PPV Contact information: [address] [place] [address] [phone]                 [facility] CNM, FNP-C, PMHNP-BC  [address]  [place], [place] [address]  Cell: [phone]  Office Phone: [phone] Fax: [phone] 06/14/2023  9:24 AM

## 2023-06-25 ENCOUNTER — Telehealth: Payer: Self-pay | Admitting: Family Medicine

## 2023-06-25 ENCOUNTER — Encounter: Payer: Self-pay | Admitting: Family Medicine

## 2023-06-25 ENCOUNTER — Ambulatory Visit (INDEPENDENT_AMBULATORY_CARE_PROVIDER_SITE_OTHER): Payer: Managed Care, Other (non HMO) | Admitting: Family Medicine

## 2023-06-25 VITALS — BP 133/88 | HR 70 | Ht 67.0 in | Wt 208.0 lb

## 2023-06-25 DIAGNOSIS — F418 Other specified anxiety disorders: Secondary | ICD-10-CM | POA: Diagnosis not present

## 2023-06-25 DIAGNOSIS — R32 Unspecified urinary incontinence: Secondary | ICD-10-CM | POA: Diagnosis not present

## 2023-06-25 DIAGNOSIS — F53 Postpartum depression: Secondary | ICD-10-CM | POA: Insufficient documentation

## 2023-06-25 LAB — POCT URINALYSIS DIP (CLINITEK)
Bilirubin, UA: NEGATIVE
Glucose, UA: NEGATIVE mg/dL
Ketones, POC UA: NEGATIVE mg/dL
Nitrite, UA: NEGATIVE
POC PROTEIN,UA: 30 — AB
Spec Grav, UA: 1.02 (ref 1.010–1.025)
Urobilinogen, UA: 0.2 U/dL
pH, UA: 6 (ref 5.0–8.0)

## 2023-06-25 MED ORDER — NITROFURANTOIN MONOHYD MACRO 100 MG PO CAPS: 100.0000 mg | ORAL_CAPSULE | Freq: Two times a day (BID) | ORAL | 0 refills | Status: DC

## 2023-06-25 MED ORDER — ESCITALOPRAM OXALATE 10 MG PO TABS: ORAL_TABLET | ORAL | 1 refills | Status: DC

## 2023-06-25 NOTE — Addendum Note (Signed)
Addended by: Deno Etienne on: 06/25/2023 02:48 PM   Modules accepted: Orders

## 2023-06-25 NOTE — Telephone Encounter (Signed)
Call patient and let her know that urine looks like she has UTI.  Sent an antibiotic to the pharmacy.

## 2023-06-25 NOTE — Progress Notes (Signed)
Established Patient Office Visit  Subjective   Patient ID: Alyssa Sparks, female    DOB: Jun 03, 1991  Age: 32 y.o. MRN: [id number]  No chief complaint on file.   HPI She just gave birth to her second daughter on September 17.  She is just been feeling very emotional and down and anxious.  She has previously been on Lexapro after the birth of her first child and would like to go back on medication.  She is still out of work after giving birth and will likely go back around Thanksgiving.  Thoughts of wanting to harm herself she has good support with her mother who helps her also take care of her children.  Delivery the baby had shoulder dystocia and she did experience tearing.  He also has noted some urinary incontinence since she gave birth they had to catheterize her multiple times and at one point the catheter was not fully on her bladder and they were finally able to remove it she was actually urinating around the catheter.  Wondering if she has an infection or if it may be just caused some irritation but since then she has been leaking urine and not making it to the bathroom.  She is not currently nursing.    ROS    Objective:     BP 133/88   Pulse 70   Ht 5\' 7"  (1.702 m)   Wt 208 lb (94.3 kg)   LMP 09/12/2022   SpO2 99%   Breastfeeding Unknown   BMI 32.58 kg/m    Physical Exam Vitals and nursing note reviewed.  Constitutional:      Appearance: Normal appearance.  HENT:     Head: Normocephalic and atraumatic.  Eyes:     Conjunctiva/sclera: Conjunctivae normal.  Cardiovascular:     Rate and Rhythm: Normal rate and regular rhythm.  Pulmonary:     Effort: Pulmonary effort is normal.     Breath sounds: Normal breath sounds.  Skin:    General: Skin is warm and dry.  Neurological:     Mental Status: She is alert.  Psychiatric:        Mood and Affect: Mood normal.      Results for orders placed or performed in visit on 06/25/23  POCT URINALYSIS DIP  (CLINITEK)  Result Value Ref Range   Color, UA straw (A) yellow   Clarity, UA cloudy (A) clear   Glucose, UA negative negative mg/dL   Bilirubin, UA negative negative   Ketones, POC UA negative negative mg/dL   Spec Grav, UA 1.191 4.782 - 1.025   Blood, UA large (A) negative   pH, UA 6.0 5.0 - 8.0   POC PROTEIN,UA =30 (A) negative, trace   Urobilinogen, UA 0.2 0.2 or 1.0 E.U./dL   Nitrite, UA Negative Negative   Leukocytes, UA Moderate (2+) (A) Negative      The ASCVD Risk score (Arnett DK, et al., 2019) failed to calculate for the following reasons:   The 2019 ASCVD risk score is only valid for ages 105 to 70    Assessment & Plan:   Problem List Items Addressed This Visit       Other   Postpartum depression    Gust options of starting medication and/or therapy/counseling.  She would like to start with the medication and see how that goes.  Will restart Lexapro at 5 mg for 10 days and then go up to 10 mg.  Follow-up in 3 to 4 weeks we can  make any adjustments or changes at that time.  Continue to work on stress reduction.  Again she has good support system with her mother.  Can consider counseling in the future.      Relevant Medications   escitalopram (LEXAPRO) 10 MG tablet   Other Visit Diagnoses     Depression with anxiety    -  Primary   Relevant Medications   escitalopram (LEXAPRO) 10 MG tablet   Urinary incontinence, unspecified type       Relevant Orders   POCT URINALYSIS DIP (CLINITEK) (Completed)      Urinary incontinence-urinalysis shows moderate leukocytes and blood.  Will go ahead and treat with Macrobid.  Call if not improving over the next week.  She does have her routine postpartum checkup tomorrow with GYN and they will examine the vaginal area and the sutures that they had to place.  Return in about 4 weeks (around 07/23/2023) for New start medication.    Nani Gasser, MD

## 2023-06-25 NOTE — Assessment & Plan Note (Signed)
Gust options of starting medication and/or therapy/counseling.  She would like to start with the medication and see how that goes.  Will restart Lexapro at 5 mg for 10 days and then go up to 10 mg.  Follow-up in 3 to 4 weeks we can make any adjustments or changes at that time.  Continue to work on stress reduction.  Again she has good support system with her mother.  Can consider counseling in the future.

## 2023-06-26 NOTE — Telephone Encounter (Signed)
Patient advised. She did pick up the prescription.

## 2023-06-27 LAB — URINE CULTURE

## 2023-06-27 NOTE — Progress Notes (Signed)
Hi Alyssa Sparks, no sign of UTI

## 2023-06-29 ENCOUNTER — Other Ambulatory Visit: Payer: Self-pay | Admitting: *Deleted

## 2023-06-29 MED ORDER — ESCITALOPRAM OXALATE 10 MG PO TABS: ORAL_TABLET | ORAL | 1 refills | Status: DC

## 2023-07-06 ENCOUNTER — Encounter: Payer: Self-pay | Admitting: Family Medicine

## 2023-07-11 ENCOUNTER — Telehealth (HOSPITAL_COMMUNITY): Payer: Self-pay

## 2023-07-11 NOTE — Telephone Encounter (Signed)
07/11/2023 1154  Name: Alyssa Sparks MRN: [id number] DOB: 12-26-1990  Reason for Call:  Transition of Care Hospital Discharge Call  Contact Status: Patient Contact Status: Complete  Language assistant needed: Interpreter Mode: Interpreter Not Needed        Follow-Up Questions: Do You Have Any Concerns About Your Health As You Heal From Delivery?: No Do You Have Any Concerns About Your Infants Health?: No  Edinburgh Postnatal Depression Scale:  In the Past 7 Days:    PHQ2-9 Depression Scale:     Discharge Follow-up: Edinburgh score requires follow up?:  (Patient states that she did EPDS with her provider recently and started back on her lexapro. She states that she is feeling good. She declines wanting to do EPDS today.) Patient was advised of the following resources:: Breastfeeding Support Group, Support Group  Post-discharge interventions: Reviewed Newborn Safe Sleep Practices  Signature  Signe Colt

## 2023-07-16 ENCOUNTER — Telehealth: Payer: Self-pay | Admitting: General Practice

## 2023-07-16 NOTE — Transitions of Care (Post Inpatient/ED Visit) (Signed)
   07/16/2023  Name: Alyssa Sparks MRN: [id number] DOB: 08/14/1991  Today's TOC FU Call Status: Today's TOC FU Call Status:: Unsuccessful Call (1st Attempt) Unsuccessful Call (1st Attempt) Date: 07/16/23  Attempted to reach the patient regarding the most recent Inpatient/ED visit.  Follow Up Plan: Additional outreach attempts will be made to reach the patient to complete the Transitions of Care (Post Inpatient/ED visit) call.   Signature Modesto Charon, Control and instrumentation engineer

## 2023-07-17 NOTE — Transitions of Care (Post Inpatient/ED Visit) (Signed)
   07/17/2023  Name: Alyssa Sparks MRN: [id number] DOB: Aug 25, 1991  Today's TOC FU Call Status: Today's TOC FU Call Status:: Unsuccessful Call (2nd Attempt) Unsuccessful Call (1st Attempt) Date: 07/16/23 Unsuccessful Call (2nd Attempt) Date: 07/17/23  Attempted to reach the patient regarding the most recent Inpatient/ED visit.  Follow Up Plan: Additional outreach attempts will be made to reach the patient to complete the Transitions of Care (Post Inpatient/ED visit) call.   Signature Modesto Charon, Control and instrumentation engineer

## 2023-07-18 NOTE — Transitions of Care (Post Inpatient/ED Visit) (Signed)
   07/18/2023  Name: Alyssa Sparks MRN: [id number] DOB: November 04, 1990  Today's TOC FU Call Status: Today's TOC FU Call Status:: Unsuccessful Call (3rd Attempt) Unsuccessful Call (1st Attempt) Date: 07/16/23 Unsuccessful Call (2nd Attempt) Date: 07/17/23 Unsuccessful Call (3rd Attempt) Date: 07/18/23  Attempted to reach the patient regarding the most recent Inpatient/ED visit.  Follow Up Plan: Additional outreach attempts will be made to reach the patient to complete the Transitions of Care (Post Inpatient/ED visit) call.   Signature Modesto Charon, Control and instrumentation engineer

## 2023-07-24 ENCOUNTER — Encounter: Payer: Self-pay | Admitting: Family Medicine

## 2023-07-24 ENCOUNTER — Ambulatory Visit (INDEPENDENT_AMBULATORY_CARE_PROVIDER_SITE_OTHER): Payer: Managed Care, Other (non HMO) | Admitting: Family Medicine

## 2023-07-24 VITALS — BP 129/84 | HR 69 | Ht 67.0 in | Wt 206.0 lb

## 2023-07-24 DIAGNOSIS — R21 Rash and other nonspecific skin eruption: Secondary | ICD-10-CM | POA: Diagnosis not present

## 2023-07-24 DIAGNOSIS — F53 Postpartum depression: Secondary | ICD-10-CM | POA: Diagnosis not present

## 2023-07-24 DIAGNOSIS — I10 Essential (primary) hypertension: Secondary | ICD-10-CM | POA: Diagnosis not present

## 2023-07-24 MED ORDER — ESCITALOPRAM OXALATE 10 MG PO TABS
10.0000 mg | ORAL_TABLET | Freq: Every day | ORAL | 3 refills | Status: AC
Start: 2023-07-24 — End: ?

## 2023-07-24 MED ORDER — AMLODIPINE BESYLATE 5 MG PO TABS: ORAL_TABLET | ORAL | 1 refills | Status: DC

## 2023-07-24 NOTE — Progress Notes (Signed)
Pt reports that she is doing better on the current regimen

## 2023-07-24 NOTE — Progress Notes (Signed)
Established Patient Office Visit  Subjective   Patient ID: Alyssa Sparks, female    DOB: 1991-09-10  Age: 32 y.o. MRN: [id number]  No chief complaint on file.   HPI  She is here to follow-up on new start Lexapro for post partum depression with anxiety-she has really tolerated medication well she denies any side effects and says its actually been really helpful she has not felt nearly as sad or down and she says if anything for her say that she has been a little bit more goofy.  But she has been happy with the benefit so far.   She also wanted let me know that she recently just went to the emergency department for uncontrolled blood pressure.  Her OB/GYN was treating her with medication but just really was not controlling the pressure and so she went to the ED.Given amlodine and told to start taking half a tab. Cam off her old BP med. Tolerating well but noticing DBP still going into the 90s in the evenings.    She also has a rash on her left upper chest that is been there for probably about a year she treated it initially with over-the-counter hydrocortisone which was recommended by provider but it never went away.  She says it occasionally gets itchy but usually its mild      ROS    Objective:     BP 129/84   Pulse 69   Ht 5\' 7"  (1.702 m)   Wt 206 lb (93.4 kg)   LMP 09/12/2022   SpO2 100%   Breastfeeding No   BMI 32.26 kg/m    Physical Exam Vitals and nursing note reviewed.  Constitutional:      Appearance: Normal appearance.  HENT:     Head: Normocephalic and atraumatic.  Eyes:     Conjunctiva/sclera: Conjunctivae normal.  Cardiovascular:     Rate and Rhythm: Normal rate and regular rhythm.  Pulmonary:     Effort: Pulmonary effort is normal.     Breath sounds: Normal breath sounds.  Skin:    General: Skin is warm and dry.  Neurological:     Mental Status: She is alert.  Psychiatric:        Mood and Affect: Mood normal.      No results found for  any visits on 07/24/23.    The ASCVD Risk score (Arnett DK, et al., 2019) failed to calculate for the following reasons:   The 2019 ASCVD risk score is only valid for ages 29 to 61    Assessment & Plan:   Problem List Items Addressed This Visit       Cardiovascular and Mediastinum   Essential hypertension    Discussed eating less than 2000 mg sodium per day Discussed DASH diet as well. HO provided. Will change amdipine to 5mg  in AM and 1/2 tab in the evening.        Relevant Medications   amLODipine (NORVASC) 5 MG tablet     Other   Postpartum depression    Doing well on the medication so far. No side effects. RF for 90 days.  F/U in       Relevant Medications   escitalopram (LEXAPRO) 10 MG tablet   Other Visit Diagnoses     Rash    -  Primary   Relevant Orders   Fungus Stain      Rash-skin scraping performed will call with results suspect tinea versicolor.wil call with results. Didn't resolve with  OTC steroid cream.   Return in about 4 months (around 11/23/2023) for Mood, Hypertension.    Nani Gasser, MD

## 2023-07-24 NOTE — Assessment & Plan Note (Signed)
Doing well on the medication so far. No side effects. RF for 90 days.  F/U in 

## 2023-07-24 NOTE — Assessment & Plan Note (Addendum)
Discussed eating less than 2000 mg sodium per day Discussed DASH diet as well. HO provided. Will change amdipine to 5mg  in AM and 1/2 tab in the evening.

## 2023-07-28 LAB — FUNGUS STAIN

## 2023-07-29 MED ORDER — KETOCONAZOLE 2 % EX CREA: 1.0000 | TOPICAL_CREAM | Freq: Every day | CUTANEOUS | 0 refills | Status: DC

## 2023-07-29 NOTE — Addendum Note (Signed)
Addended by: Nani Gasser D on: 07/29/2023 09:27 AM   Modules accepted: Orders

## 2023-07-29 NOTE — Progress Notes (Signed)
Hi Chandrika, the skin scraping came back positive for fungal skin infection. I will send over a cream to your pharmacy. Please apply twice a day for 4 weeks. If not improving then let me know

## 2023-11-27 ENCOUNTER — Encounter: Payer: Self-pay | Admitting: Family Medicine

## 2023-11-27 ENCOUNTER — Ambulatory Visit (INDEPENDENT_AMBULATORY_CARE_PROVIDER_SITE_OTHER): Payer: Managed Care, Other (non HMO) | Admitting: Family Medicine

## 2023-11-27 VITALS — BP 128/84 | HR 75 | Ht 67.0 in | Wt 228.0 lb

## 2023-11-27 DIAGNOSIS — F320 Major depressive disorder, single episode, mild: Secondary | ICD-10-CM | POA: Diagnosis not present

## 2023-11-27 DIAGNOSIS — I10 Essential (primary) hypertension: Secondary | ICD-10-CM

## 2023-11-27 DIAGNOSIS — F331 Major depressive disorder, recurrent, moderate: Secondary | ICD-10-CM | POA: Insufficient documentation

## 2023-11-27 MED ORDER — AMLODIPINE BESYLATE 5 MG PO TABS: 5.0000 mg | ORAL_TABLET | Freq: Every day | ORAL | Status: DC

## 2023-11-27 NOTE — Assessment & Plan Note (Signed)
 The Lexapro was initially started for postpartum depression but she has a lot of stressors currently at home.  In addition to trying to raise her kids.  PHQ-9 score of 8 today and GAD-7 score of 12.  Will continue with Lexapro.  Follow-up in 6 months.  Call if any problems or concerns.

## 2023-11-27 NOTE — Assessment & Plan Note (Signed)
 Blood pressure elevated today but she forgot to take her medication this morning she was staying at her mom's and so have left her medications at her house.  But she does normally take it and she follows her blood pressures at work and usually gets good control.  For short period of time she was taking amlodipine twice a day but her blood pressure was dropping a little too low at night so she went back to once a day.

## 2023-11-27 NOTE — Progress Notes (Signed)
   Established Patient Office Visit  Subjective  Patient ID: Alyssa Sparks, female    DOB: 1991-04-05  Age: 33 y.o. MRN: [id number]  Chief Complaint  Patient presents with   Hypertension    HPI  6 mo f/u  She still working 3, 12-hour shifts a week and works at night so that her mom can keep her kids and she can stand with them during the day but she is still back and forth between her mom's house and her place with her boyfriend.  Unfortunately her boyfriend's ex has been causing a lot of problems over their shared child.     ROS    Objective:     BP 128/84   Pulse 75   Ht 5\' 7"  (1.702 m)   Wt 228 lb (103.4 kg)   SpO2 100%   BMI 35.71 kg/m    Physical Exam Vitals and nursing note reviewed.  Constitutional:      Appearance: Normal appearance.  HENT:     Head: Normocephalic and atraumatic.  Eyes:     Conjunctiva/sclera: Conjunctivae normal.  Cardiovascular:     Rate and Rhythm: Normal rate and regular rhythm.  Pulmonary:     Effort: Pulmonary effort is normal.     Breath sounds: Normal breath sounds.  Skin:    General: Skin is warm and dry.  Neurological:     Mental Status: She is alert.  Psychiatric:        Mood and Affect: Mood normal.      No results found for any visits on 11/27/23.    The ASCVD Risk score (Arnett DK, et al., 2019) failed to calculate for the following reasons:   The 2019 ASCVD risk score is only valid for ages 21 to 47    Assessment & Plan:   Problem List Items Addressed This Visit       Cardiovascular and Mediastinum   Essential hypertension - Primary   Blood pressure elevated today but she forgot to take her medication this morning she was staying at her mom's and so have left her medications at her house.  But she does normally take it and she follows her blood pressures at work and usually gets good control.  For short period of time she was taking amlodipine twice a day but her blood pressure was dropping a little too  low at night so she went back to once a day.      Relevant Medications   amLODipine (NORVASC) 5 MG tablet     Other   Depression, major, single episode, mild (HCC)   The Lexapro was initially started for postpartum depression but she has a lot of stressors currently at home.  In addition to trying to raise her kids.  PHQ-9 score of 8 today and GAD-7 score of 12.  Will continue with Lexapro.  Follow-up in 6 months.  Call if any problems or concerns.       Return in about 6 months (around 05/29/2024) for Mood, Hypertension.    Nani Gasser, MD

## 2024-01-05 ENCOUNTER — Other Ambulatory Visit: Payer: Self-pay | Admitting: Family Medicine

## 2024-03-10 ENCOUNTER — Ambulatory Visit (INDEPENDENT_AMBULATORY_CARE_PROVIDER_SITE_OTHER): Admitting: Family Medicine

## 2024-03-10 VITALS — BP 136/87 | HR 73 | Ht 67.0 in | Wt 238.3 lb

## 2024-03-10 DIAGNOSIS — F320 Major depressive disorder, single episode, mild: Secondary | ICD-10-CM

## 2024-03-10 DIAGNOSIS — I10 Essential (primary) hypertension: Secondary | ICD-10-CM

## 2024-03-10 MED ORDER — ESCITALOPRAM OXALATE 20 MG PO TABS
20.0000 mg | ORAL_TABLET | Freq: Every day | ORAL | 1 refills | Status: DC
Start: 2024-03-10 — End: 2024-05-08

## 2024-03-10 NOTE — Patient Instructions (Signed)
 Send mychart in a few weeks and let me know how you are doing on the increased dose.

## 2024-03-10 NOTE — Progress Notes (Signed)
 Established Patient Office Visit  Subjective  Patient ID: Zyanne Jacora Dancer, female    DOB: 1991-03-10  Age: 33 y.o. MRN: [id number]  Chief Complaint  Patient presents with   Depression    Patient states she does not feel that Lexapro  is working as well as it normally was.     HPI  She is here today to follow-up on her depression.  She currently is on Lexapro  10 mg she is actually been on it for a while it was actually started postpartum I had just seen her in March and at that time she was pretty stable.  She says she feels like it is not working as well as it used to  Has been under more stress recently.  She is struggling a little bit with childcare.  Her grandparents were helping but they also live over an hour away.  And her grandfather's been sick.  But she has hired somebody to come in and help in the morning so that she can try to sleep after she works her 12-hour shift.  She is having some issues with her children's diet as well.  She feels like she has been more irritable she feels like the Lexapro  is not helping as well as it used to.    ROS    Objective:     BP 136/87   Pulse 73   Ht 5' 7 (1.702 m)   Wt 238 lb 5 oz (108.1 kg)   SpO2 99%   BMI 37.33 kg/m    Physical Exam Vitals and nursing note reviewed.  Constitutional:      Appearance: Normal appearance.  HENT:     Head: Normocephalic and atraumatic.   Eyes:     Conjunctiva/sclera: Conjunctivae normal.    Cardiovascular:     Rate and Rhythm: Normal rate and regular rhythm.  Pulmonary:     Effort: Pulmonary effort is normal.     Breath sounds: Normal breath sounds.   Skin:    General: Skin is warm and dry.   Neurological:     Mental Status: She is alert.   Psychiatric:        Mood and Affect: Mood normal.      No results found for any visits on 03/10/24.    The ASCVD Risk score (Arnett DK, et al., 2019) failed to calculate for the following reasons:   The 2019 ASCVD risk score is  only valid for ages 47 to 50    Assessment & Plan:   Problem List Items Addressed This Visit       Cardiovascular and Mediastinum   Essential hypertension   Her little elevated today but she has not taken her blood pressure pill yet.  But otherwise doing well.        Other   Depression, major, single episode, mild (HCC) - Primary   Gust options.  We could try increasing the medication, switching completely or even doing an add-on such as Wellbutrin.  Will start by bumping up the Lexapro  to 20 mg first.  I do think therapy/counseling would be helpful as well I do think she has some acute stressors that are really affecting her mood.  If not helpful with bumping the dose then we can also taper off and switch to a different medication she is only ever tried Lexapro .    Also encouraged her to check out [facility] through work as they may offer some free counseling sessions that might be a way for  her to get started and see if she finds that it is helpful.      Relevant Medications   escitalopram  (LEXAPRO ) 20 MG tablet    Return in about 6 weeks (around 04/21/2024) for Mood.    Duaine German, MD

## 2024-03-10 NOTE — Assessment & Plan Note (Signed)
 Gust options.  We could try increasing the medication, switching completely or even doing an add-on such as Wellbutrin.  Will start by bumping up the Lexapro  to 20 mg first.  I do think therapy/counseling would be helpful as well I do think Alyssa Sparks has some acute stressors that are really affecting her mood.  If not helpful with bumping the dose then we can also taper off and switch to a different medication Alyssa Sparks is only ever tried Lexapro .    Also encouraged her to check out [employer] through work as they may offer some free counseling sessions that might be a way for her to get started and see if Alyssa Sparks finds that it is helpful.

## 2024-03-10 NOTE — Assessment & Plan Note (Signed)
 Her little elevated today but she has not taken her blood pressure pill yet.  But otherwise doing well.

## 2024-05-05 ENCOUNTER — Ambulatory Visit: Admitting: Family Medicine

## 2024-05-08 ENCOUNTER — Other Ambulatory Visit: Payer: Self-pay | Admitting: Family Medicine

## 2024-05-08 DIAGNOSIS — F320 Major depressive disorder, single episode, mild: Secondary | ICD-10-CM

## 2024-05-27 ENCOUNTER — Ambulatory Visit: Admitting: Family Medicine

## 2024-05-30 ENCOUNTER — Ambulatory Visit: Admitting: Family Medicine

## 2024-06-19 ENCOUNTER — Ambulatory Visit (INDEPENDENT_AMBULATORY_CARE_PROVIDER_SITE_OTHER): Admitting: Family Medicine

## 2024-06-19 ENCOUNTER — Encounter: Payer: Self-pay | Admitting: Family Medicine

## 2024-06-19 VITALS — BP 134/87 | HR 86 | Ht 67.0 in | Wt 244.0 lb

## 2024-06-19 DIAGNOSIS — R194 Change in bowel habit: Secondary | ICD-10-CM

## 2024-06-19 DIAGNOSIS — R350 Frequency of micturition: Secondary | ICD-10-CM | POA: Diagnosis not present

## 2024-06-19 DIAGNOSIS — F320 Major depressive disorder, single episode, mild: Secondary | ICD-10-CM | POA: Diagnosis not present

## 2024-06-19 DIAGNOSIS — N3000 Acute cystitis without hematuria: Secondary | ICD-10-CM | POA: Diagnosis not present

## 2024-06-19 LAB — POCT URINALYSIS DIP (CLINITEK)
Bilirubin, UA: NEGATIVE
Glucose, UA: NEGATIVE mg/dL
Ketones, POC UA: NEGATIVE mg/dL
Nitrite, UA: NEGATIVE
POC PROTEIN,UA: NEGATIVE
Spec Grav, UA: >=1.03 — AB (ref 1.010–1.025)
Urobilinogen, UA: 0.2 U/dL
pH, UA: 6 (ref 5.0–8.0)

## 2024-06-19 MED ORDER — NITROFURANTOIN MONOHYD MACRO 100 MG PO CAPS: 100.0000 mg | ORAL_CAPSULE | Freq: Two times a day (BID) | ORAL | 0 refills | Status: DC

## 2024-06-19 NOTE — Progress Notes (Signed)
 Established Patient Office Visit  Subjective  Patient ID: Alyssa Sparks, female    DOB: 1991-05-11  Age: 33 y.o. MRN: [id number]  Chief Complaint  Patient presents with   Hypertension   mood   Urinary Frequency    X 3 weeks, odor    HPI Discussed the use of AI scribe software for clinical note transcription with the patient, who gave verbal consent to proceed.  History of Present Illness Alyssa Sparks is a 33 year old female who presents with urinary symptoms and bowel changes.  Lower urinary tract symptoms - Malodorous urine - Increased urinary frequency - Associated vaginal discharge but not unsual when she spots from her IUD - Concern regarding etiology of symptoms  Bowel habit changes - Urgency to have a bowel movement immediately after meals - Occasional loose stools, but no diarrhea - No constipation  Abnormal uterine bleeding and vaginal discharge - Spotting associated with intrauterine device (IUD) - Increased vaginal discharge - Amenorrhea for an extended period, with suspicion of menstrual cycle returning  Psychological well-being and recent medication changes - Recently discontinued Lexapro  over the past four weeks (tapered from 10 mg to 5 mg, then stopped) - No current symptoms of depression or anxiety - Managing stress related to caring for a toddler and family responsibilities - Receiving support from a chaplain at work - No current counseling established     ROS    Objective:     BP 134/87   Pulse 86   Ht 5' 7 (1.702 m)   Wt 244 lb (110.7 kg)   SpO2 96%   BMI 38.22 kg/m    Physical Exam Vitals and nursing note reviewed.  Constitutional:      Appearance: Normal appearance.  HENT:     Head: Normocephalic and atraumatic.  Eyes:     Conjunctiva/sclera: Conjunctivae normal.  Cardiovascular:     Rate and Rhythm: Normal rate and regular rhythm.  Pulmonary:     Effort: Pulmonary effort is normal.     Breath sounds: Normal  breath sounds.  Skin:    General: Skin is warm and dry.  Neurological:     Mental Status: She is alert.  Psychiatric:        Mood and Affect: Mood normal.      Results for orders placed or performed in visit on 06/19/24  POCT URINALYSIS DIP (CLINITEK)  Result Value Ref Range   Color, UA yellow yellow   Clarity, UA cloudy (A) clear   Glucose, UA negative negative mg/dL   Bilirubin, UA negative negative   Ketones, POC UA negative negative mg/dL   Spec Grav, UA >=8.969 (A) 1.010 - 1.025   Blood, UA trace-intact (A) negative   pH, UA 6.0 5.0 - 8.0   POC PROTEIN,UA negative negative, trace   Urobilinogen, UA 0.2 0.2 or 1.0 E.U./dL   Nitrite, UA Negative Negative   Leukocytes, UA Trace (A) Negative      The ASCVD Risk score (Arnett DK, et al., 2019) failed to calculate for the following reasons:   The 2019 ASCVD risk score is only valid for ages 61 to 44    Assessment & Plan:   Problem List Items Addressed This Visit       Other   Depression, major, single episode, mild    Weaning off Lexapro  with no significant depressive symptoms. Busy lifestyle with limited support. Spoke with chaplain, no formal counseling engaged. - Discontinue Lexapro . - Encourage consideration of counseling for support.  Other Visit Diagnoses       Urine frequency    -  Primary   Relevant Orders   POCT URINALYSIS DIP (CLINITEK) (Completed)   Urine Culture     Acute cystitis without hematuria       Relevant Medications   nitrofurantoin , macrocrystal-monohydrate, (MACROBID ) 100 MG capsule     Change in bowel habit           Assessment and Plan Assessment & Plan Urinary tract infection Urinalysis indicated leukocytes, suggesting UTI. Empirical treatment justified by symptoms and urinalysis. - Prescribe nitrofurantoin . - Advise to report if symptoms do not improve.  Change in bowel habit Postprandial bowel movements with loose stools. Differential includes IBS or dietary  triggers. Symptoms may improve with UTI resolution. - Advise trial of elimination diet to identify dietary triggers. - Consider gastroenterology referral if symptoms persist.     Return if symptoms worsen or fail to improve.    Dorothyann Byars, MD

## 2024-06-19 NOTE — Assessment & Plan Note (Signed)
  Weaning off Lexapro  with no significant depressive symptoms. Busy lifestyle with limited support. Spoke with chaplain, no formal counseling engaged. - Discontinue Lexapro . - Encourage consideration of counseling for support.

## 2024-06-23 ENCOUNTER — Ambulatory Visit: Payer: Self-pay | Admitting: Family Medicine

## 2024-06-23 LAB — URINE CULTURE

## 2024-06-23 NOTE — Progress Notes (Signed)
 HI Alyssa Sparks,  Your urine culture came back positive for a bacteria called E. coli.  The antibiotic I put you on should be correct based on the culture and sensitivity hopefully you are feeling much better!

## 2024-06-26 NOTE — Telephone Encounter (Signed)
 Electronic request sent to [facility] today for pap smear.

## 2024-08-05 ENCOUNTER — Encounter: Payer: Self-pay | Admitting: Family Medicine

## 2024-08-07 ENCOUNTER — Telehealth (INDEPENDENT_AMBULATORY_CARE_PROVIDER_SITE_OTHER): Admitting: Sports Medicine

## 2024-08-07 DIAGNOSIS — B084 Enteroviral vesicular stomatitis with exanthem: Secondary | ICD-10-CM

## 2024-08-07 NOTE — Progress Notes (Unsigned)
 Careteam: Patient Care Team: Alvan Dorothyann BIRCH, MD as PCP - General (Family Medicine) Verta Blossom, CNM as PCP - OBGYN (Obstetrics and Gynecology) Armond Cape, MD as Consulting Physician (Obstetrics and Gynecology)  No Known Allergies  No chief complaint on file.   Discussed the use of AI scribe software for clinical note transcription with the patient, who gave verbal consent to proceed.  History of Present Illness  Fever - 2 days  Runny nose - neg Cough - dry  Denies chest pain, sob, abdominal pain, nausea, vomiting,  Headache - 2 days  mild  Poor appetite  but trying to drink more water      Review of Systems:  ROS Negative unless indicated in HPI.   Past Medical History:  Diagnosis Date   Acute stress reaction 11/01/2019   Anxiety    Broken arm    RIGHT   Chronic bilateral low back pain without sciatica 06/02/2016   Gestational diabetes    History of shoulder dystocia in prior pregnancy 11/22/2022   G1  Delivery note reviewed - resolved with McRoberts & suprapubic  Infant weight 3800g, had GDM w/ pregnancy  Infant dx w/ clavicle fracture after delivery   Infertility, female    Macromastia 06/02/2016   PCOS (polycystic ovarian syndrome) 10/14/2015   Polycystic ovarian disease    Past Surgical History:  Procedure Laterality Date   NO PAST SURGERIES     Social History:   reports that she has never smoked. She has never used smokeless tobacco. She reports that she does not currently use alcohol. She reports that she does not use drugs.  Family History  Problem Relation Age of Onset   Breast cancer Other    Hypertension Mother    Diabetes Mother    Heart attack Father    Diabetes Maternal Grandmother    Hypertension Maternal Grandmother    Deep vein thrombosis Maternal Grandmother    Cancer Maternal Grandmother    Diabetes Maternal Grandfather    Hypertension Maternal Grandfather    Prostate cancer Maternal Grandfather    Cancer Maternal  Grandfather        PROSTATE   COPD Maternal Grandfather     Medications: Patient's Medications  New Prescriptions   No medications on file  Previous Medications   AMLODIPINE  (NORVASC ) 5 MG TABLET    Take 1 tablet (5 mg total) by mouth daily. Take 5mg  PO QAM and 1/2 tab PO each evening   IBUPROFEN  (ADVIL ) 600 MG TABLET    Take 1 tablet (600 mg total) by mouth every 6 (six) hours.   LEVONORGESTREL (MIRENA, 52 MG,) 20 MCG/DAY IUD    1 each by Intrauterine route.   NITROFURANTOIN , MACROCRYSTAL-MONOHYDRATE, (MACROBID ) 100 MG CAPSULE    Take 1 capsule (100 mg total) by mouth 2 (two) times daily.  Modified Medications   No medications on file  Discontinued Medications   No medications on file    Physical Exam: There were no vitals filed for this visit. There is no height or weight on file to calculate BMI. BP Readings from Last 3 Encounters:  06/19/24 134/87  03/10/24 136/87  11/27/23 128/84   Wt Readings from Last 3 Encounters:  06/19/24 244 lb (110.7 kg)  03/10/24 238 lb 5 oz (108.1 kg)  11/27/23 228 lb (103.4 kg)    Physical Exam  Labs reviewed: Basic Metabolic Panel: No results for input(s): NA, K, CL, CO2, GLUCOSE, BUN, CREATININE, CALCIUM, MG, PHOS, TSH in the last 8760 hours. Liver  Function Tests: No results for input(s): AST, ALT, ALKPHOS, BILITOT, PROT, ALBUMIN in the last 8760 hours. No results for input(s): LIPASE, AMYLASE in the last 8760 hours. No results for input(s): AMMONIA in the last 8760 hours. CBC: No results for input(s): WBC, NEUTROABS, HGB, HCT, MCV, PLT in the last 8760 hours. Lipid Panel: No results for input(s): CHOL, HDL, LDLCALC, TRIG, CHOLHDL, LDLDIRECT in the last 8760 hours. TSH: No results for input(s): TSH in the last 8760 hours. A1C: Lab Results  Component Value Date   HGBA1C 4.9 11/22/2022    Assessment & Plan   Assessment and Plan Assessment & Plan       No  follow-ups on file.:   Alyssa Sparks

## 2024-08-08 ENCOUNTER — Encounter: Payer: Self-pay | Admitting: Sports Medicine

## 2024-08-24 NOTE — ED Provider Notes (Addendum)
 " [facility]  ED Provider Note  Alyssa Sparks 33 y.o. female DOB: Apr 25, 1991 MRN: [id number] History   Chief Complaint  Patient presents with   Chest Pain    To ED with chest pain x 2 days with a headache that started last night. States a secondary school teacher her BP earlier tonight and said it was high and she needed to be seen. States she's not currently taking any BP meds.    Patient reports approximately 2 days of symptoms including chest pain and headache.  Notes that the chest pain is central, tightness, no radiation.  No clear exacerbating or alleviating factors.  Chest pain onset is followed by headache which she describes as frontal, bitemporal, pressure-like.  Partial relief with Tylenol .  ROS otherwise remarkable for cough productive of intermittent clear sputum  in the setting of recent viral syndrome from her young children.   She is concerned this is related to her blood pressure.  Patient notes that she was on amlodipine  following postpartum preeclampsia and was taken off of the amlodipine  a couple of months ago and has had no issues until now.    History provided by:  Patient Chest Pain      Past Medical History[1]  Past Surgical History[2]  Social History   Substance and Sexual Activity  Alcohol Use Never   Tobacco Use History[3] E-Cigarettes   Vaping Use     Start Date     Cartridges/Day     Quit Date     Social History   Substance and Sexual Activity  Drug Use Never         Allergies[4]  Home Medications   AMLODIPINE  BESYLATE (NORVASC ) 10 MG TABLET    Take one half tablet (5 mg dose) by mouth daily for 30 days.   ASPIRIN  LOW DOSE 81 MG EC TABLET    Take one tablet (81 mg dose) by mouth daily.   AZITHROMYCIN (ZITHROMAX) 500 MG TABLET    Take 4 tablets by oral route.   BIOTIN 1 MG CAPS    biotin   CYCLOBENZAPRINE (FLEXERIL) 10 MG TABLET       ESCITALOPRAM  OXALATE (LEXAPRO ) 10 MG TABLET       MULTIPLE  VITAMINS-MINERALS (MULTIVITAMIN ADULT EXTRA C PO)    multivitamin   NAPROXEN  SODIUM (ANAPROX ) 550 MG TABLET       NORETHINDRONE-ETHINYL ESTRADIOL -IRON (ESTROSTEP FE,TRI-LEGEST FE,TILIA FE) 1-20/1-30/1-35 MG-MCG TABS    daily.   PANTOPRAZOLE SODIUM (PROTONIX) 20 MG TABLET    Take one tablet (20 mg dose) by mouth daily.   POTASSIUM CHLORIDE (KLOR-CON 10) 10 MEQ TABLET    Take one tablet (10 mEq dose) by mouth 2 (two) times daily.   PROMETHAZINE (PHENERGAN,PHENADOZ) 25 MG SUPPOSITORY    Place one half suppository to one suppository (12.5-25 mg dose) rectally every 6 (six) hours as needed for Nausea.    Primary Survey  Primary Survey  Review of Systems   Review of Systems  Cardiovascular:  Positive for chest pain.  All other systems reviewed and are negative.   Physical Exam   ED Triage Vitals  BP   Pulse   Resp   SpO2   Temp     Physical Exam  Constitutional: She appears well-developed and well-nourished.  HENT:  Head: Normocephalic and atraumatic.  Eyes: EOM are intact. Pupils are equal, round, and reactive to light.  Neck: Normal range of motion. Neck supple.  Cardiovascular: Normal rate and regular rhythm.  Pulmonary/Chest:  Respiratory effort normal and breath sounds normal.  Abdominal: Soft.  Musculoskeletal: Normal range of motion.     Cervical back: Normal range of motion and neck supple. no edema.  Neurological: She is alert and oriented to person, place, and time. Moves all extremities equally. Gait normal. She has normal speech.  Skin: Skin is warm. Skin is dry.     ED Course   Lab results: No data to display  Imaging: No data to display   ECG: ECG Results   None                                                                        Pre-Sedation Procedures  ED Course as of 08/24/24 2349  Dorothyann HERO [facility] Documentation  Sun Aug 24, 2024  2124 XR Chest Ap Portable No PNA, PTX  2215 TnT-Gen5 (0hr): 5   2326 TnT-Gen5 (1hr): 5  2326 D-Dimer: 0.32  2330 Reassessed patient.  ACS is ruled out.  Her dimer is negative.  She is feeling better after Tylenol  and Motrin ; both her headache and chest pain are improved although the chest tightness does persist somewhat.  Discussed return to ER precautions, follow-up with her primary care physician, to whom she is already sent a message.  All questions answered  2348 Additionally, discussed with this patient possibility of starting amlodipine .  She prefers to follow-up and discuss this with her primary care physician tomorrow.  Feel this is reasonable given very mild elevation.   Medical Decision Making Patient presents with chest pain and headache.  Given lack of thunderclap onset for headache, waxing and waning bitemporal nature, predominantly suspicious for tension headache.  I do not suspect subarachnoid at this time.  Patient has no other signs or symptoms to suggest meningitis.  Regarding patient's chest pain, possibly blood pressure related however would be uncommon to have hypertensive emergency -or ACS - at 33.  Will follow troponins, EKG.  Notably, EKG demonstrates some nonspecific T wave inversions but no STEMI.  Chest x-ray without pneumonia or pneumothorax.  No pleural effusion or pulmonary edema.  Did have recent viral syndrome however pain is not pleuritic.  Doubt pericarditis.  Given lack of risk factors, mild presentation overall, equal peripheral pulses, doubt aortic pathology.  Pain is less consistent with GERD or costochondritis.  Considered PE.  However, patient has no leg swelling, no shortness of breath.  Low risk Wells.    Amount and/or Complexity of Data Reviewed Labs: ordered. Decision-making details documented in ED Course. Radiology: ordered. Decision-making details documented in ED Course. ECG/medicine tests: ordered.  Risk OTC drugs. Prescription drug management.          Provider Communication  New Prescriptions   No  medications on file    Modified Medications   No medications on file    Discontinued Medications   No medications on file    Clinical Impression Final diagnoses:  None    ED Disposition     None                 Electronically signed by:     Dorothyann HERO Irwin, MD 08/24/24 2217    Dorothyann HERO Irwin, MD 08/24/24 2349       [1]  History reviewed. No pertinent past medical history. [2] History reviewed. No pertinent surgical history. [3] Social History Tobacco Use  Smoking Status Never  Smokeless Tobacco Never  [4] No Known Allergies  Dorothyann CHRISTELLA Irwin, MD 08/27/24 [phone]  "

## 2024-08-25 ENCOUNTER — Encounter: Payer: Self-pay | Admitting: Family Medicine

## 2024-08-25 NOTE — Telephone Encounter (Signed)
 Patient scheduled for hospital follow up  and to discuss restarting Lexapro  tomorrow 08/26/2024.

## 2024-08-26 ENCOUNTER — Encounter: Payer: Self-pay | Admitting: Family Medicine

## 2024-08-26 ENCOUNTER — Ambulatory Visit: Admitting: Family Medicine

## 2024-08-26 VITALS — BP 130/84 | HR 83 | Ht 67.0 in | Wt 248.0 lb

## 2024-08-26 DIAGNOSIS — R0789 Other chest pain: Secondary | ICD-10-CM

## 2024-08-26 DIAGNOSIS — F331 Major depressive disorder, recurrent, moderate: Secondary | ICD-10-CM | POA: Diagnosis not present

## 2024-08-26 DIAGNOSIS — I10 Essential (primary) hypertension: Secondary | ICD-10-CM

## 2024-08-26 MED ORDER — AMLODIPINE BESYLATE 5 MG PO TABS: ORAL_TABLET | ORAL | 1 refills | Status: AC

## 2024-08-26 MED ORDER — ESCITALOPRAM OXALATE 10 MG PO TABS: ORAL_TABLET | ORAL | 2 refills | Status: DC

## 2024-08-26 NOTE — Assessment & Plan Note (Signed)
 Essential hypertension with recent medication nonadherence and associated chest pain Nonadherence to amlodipine  led to elevated blood pressure and chest pain. Recent ED visit ruled out acute cardiac event. - Restarted amlodipine  5 mg daily. - Instructed to monitor blood pressure and report if elevated. - Consider medication adjustment if blood pressure remains high.

## 2024-08-26 NOTE — Progress Notes (Signed)
 Established Patient Office Visit  Patient ID: Alyssa Sparks, female    DOB: 1990/12/27  Age: 33 y.o. MRN: [id number] PCP: Alvan Dorothyann BIRCH, MD  Chief Complaint  Patient presents with   Follow-up    Subjective:     HPI  Discussed the use of AI scribe software for clinical note transcription with the patient, who gave verbal consent to proceed.  History of Present Illness Alyssa Sparks is a 33 year old female with hypertension who presents for follow-up after an emergency department visit for chest pain and headache.  Chest pain and headache - Chest pain and headache occurred over a day and a half, prompting an emergency department visit on November 30th - Symptoms began while at work over the weekend - Blood pressure readings during episode: systolic up to 158 mmHg, diastolic between 95 and 105 mmHg - Relief achieved with Tylenol  and Motrin  in the emergency department - Troponin levels normal, D-dimer negative - CBC normal, potassium 3.6 mmol/L, kidney and liver function tests performed  Hypertension - Blood pressure previously stable until the week prior to presentation - Recent increase in blood pressure coincided with increased stress and hectic circumstances - Had not taken amlodipine  for a few weeks due to lapse in refilling prescription - Elevated blood pressure noted during emergency department visit  Psychological stress and medication management - Increased stress and impact of holidays contributing to symptom escalation - Previous use of Lexapro , dose increased to 20 mg during symptom escalation - Desires to restart Lexapro  due to current stress - Mother encouraged resumption of medication  Dermatologic symptoms related to hand, foot, and mouth disease - Recent episode of hand, foot, and mouth disease contracted from children - Severe symptoms including swollen and blistered hands, peeling skin - Children experienced milder symptoms - Peeling  skin exacerbated by frequent hand washing and sanitizer use at work - Using Eucerin cream for management of peeling skin   Flowsheet Row Office Visit from 08/26/2024 in [facility] Medicine at [facility]  PHQ-9 Total Score 5       ROS    Objective:     BP 130/84   Pulse 83   Ht 5' 7 (1.702 m)   Wt 248 lb (112.5 kg)   SpO2 99%   Breastfeeding No   BMI 38.84 kg/m    Physical Exam Vitals and nursing note reviewed.  Constitutional:      Appearance: Normal appearance.  HENT:     Head: Normocephalic and atraumatic.  Eyes:     Conjunctiva/sclera: Conjunctivae normal.  Cardiovascular:     Rate and Rhythm: Normal rate and regular rhythm.  Pulmonary:     Effort: Pulmonary effort is normal.     Breath sounds: Normal breath sounds.  Skin:    General: Skin is warm and dry.     Comments: Skin peeling on hands  Neurological:     Mental Status: She is alert.  Psychiatric:        Mood and Affect: Mood normal.      No results found for any visits on 08/26/24.    The ASCVD Risk score (Arnett DK, et al., 2019) failed to calculate for the following reasons:   The 2019 ASCVD risk score is only valid for ages 99 to 8    Assessment & Plan:   Problem List Items Addressed This Visit       Cardiovascular and Mediastinum   Essential hypertension   Essential  hypertension with recent medication nonadherence and associated chest pain Nonadherence to amlodipine  led to elevated blood pressure and chest pain. Recent ED visit ruled out acute cardiac event. - Restarted amlodipine  5 mg daily. - Instructed to monitor blood pressure and report if elevated. - Consider medication adjustment if blood pressure remains high.      Relevant Medications   amLODipine  (NORVASC ) 5 MG tablet     Other   MDD (major depressive disorder), recurrent episode, moderate (HCC) - Primary   Depression/Stress  Exacerbation due to stress and life events. Lexapro   previously effective but discontinued, now needs restarting. - Restart Lexapro  at 10 mg daily, starting with half a tablet for 10 days, then increasing to a full tablet. - Provided a 90-day prescription for Lexapro . - Instructed to contact via MyChart if dose adjustment to 20 mg is needed after a month or two.      Relevant Medications   escitalopram  (LEXAPRO ) 10 MG tablet   Other Visit Diagnoses       Atypical chest pain           Assessment and Plan Assessment & Plan    Atypical chest pain - it is feeling some better. Happened again last night   No follow-ups on file.    Dorothyann Byars, MD [facility] Primary Care & [facility] Medicine at [facility]

## 2024-08-26 NOTE — Assessment & Plan Note (Signed)
 Depression/Stress  Exacerbation due to stress and life events. Lexapro  previously effective but discontinued, now needs restarting. - Restart Lexapro  at 10 mg daily, starting with half a tablet for 10 days, then increasing to a full tablet. - Provided a 90-day prescription for Lexapro . - Instructed to contact via MyChart if dose adjustment to 20 mg is needed after a month or two.

## 2024-09-17 LAB — RESULTS CONSOLE HPV: CHL HPV: NEGATIVE

## 2024-09-17 LAB — HM PAP SMEAR: HM Pap smear: NEGATIVE

## 2024-10-15 ENCOUNTER — Ambulatory Visit: Payer: Self-pay

## 2024-10-15 NOTE — Telephone Encounter (Signed)
 FYI Only or Action Required?: FYI only for provider: appointment scheduled on 1/23.  Patient was last seen in primary care on 08/26/2024 by Alvan Dorothyann BIRCH, MD.  Called Nurse Triage reporting Dizziness.  Symptoms began several weeks ago.  Interventions attempted: Rest, hydration, or home remedies.  Symptoms are: stable.  Triage Disposition: See Physician Within 24 Hours  Patient/caregiver understands and will follow disposition?: Yes, but will wait   Patient has been feeling like she is gonna pass out, dizzy, sweating. Bp is dropping,   Taking amLODipine  (NORVASC ) 5 MG tablet [[id number]]  Reason for Disposition  [1] MODERATE dizziness (e.g., interferes with normal activities) AND [2] has NOT been evaluated by doctor (or NP/PA) for this  (Exception: Dizziness caused by heat exposure, sudden standing, or poor fluid intake.)  Answer Assessment - Initial Assessment Questions 1. DESCRIPTION: Describe your dizziness.     lightheaded 2. LIGHTHEADED: Do you feel lightheaded? (e.g., somewhat faint, woozy, weak upon standing)     lightheaded 3. VERTIGO: Do you feel like either you or the room is spinning or tilting? (i.e., vertigo)      4. SEVERITY: How bad is it?  Do you feel like you are going to faint? Can you stand and walk?     Has to lay down until passes 5. ONSET:  When did the dizziness begin?     Few weeks 6. AGGRAVATING FACTORS: Does anything make it worse? (e.g., standing, change in head position)     When up moving around 7. HEART RATE: Can you tell me your heart rate? How many beats in 15 seconds?  (Note: Not all patients can do this.)        8. CAUSE: What do you think is causing the dizziness? (e.g., decreased fluids or food, diarrhea, emotional distress, heat exposure, new medicine, sudden standing, vomiting; unknown)     amlodipine  9. RECURRENT SYMPTOM: Have you had dizziness before? If Yes, ask: When was the last time? What happened that  time?     no 10. OTHER SYMPTOMS: Do you have any other symptoms? (e.g., fever, chest pain, vomiting, diarrhea, bleeding)       denies 11. PREGNANCY: Is there any chance you are pregnant? When was your last menstrual period?  Protocols used: Dizziness - Lightheadedness-A-AH

## 2024-10-17 ENCOUNTER — Ambulatory Visit: Attending: Physician Assistant

## 2024-10-17 ENCOUNTER — Encounter: Payer: Self-pay | Admitting: Physician Assistant

## 2024-10-17 ENCOUNTER — Ambulatory Visit (INDEPENDENT_AMBULATORY_CARE_PROVIDER_SITE_OTHER): Admitting: Physician Assistant

## 2024-10-17 VITALS — BP 134/88 | HR 74 | Ht 67.0 in | Wt 241.5 lb

## 2024-10-17 DIAGNOSIS — R55 Syncope and collapse: Secondary | ICD-10-CM

## 2024-10-17 DIAGNOSIS — R42 Dizziness and giddiness: Secondary | ICD-10-CM | POA: Diagnosis not present

## 2024-10-17 DIAGNOSIS — Z8632 Personal history of gestational diabetes: Secondary | ICD-10-CM

## 2024-10-17 DIAGNOSIS — I10 Essential (primary) hypertension: Secondary | ICD-10-CM | POA: Diagnosis not present

## 2024-10-17 MED ORDER — FREESTYLE LIBRE 3 PLUS SENSOR MISC: Status: AC

## 2024-10-17 NOTE — Patient Instructions (Addendum)
 Heart monitor in mail.  Will refer to cardiology.  Get labs today.  Set up [facility] for 2 weeks. Follow up with PCP in 2 weeks.   Syncope, Adult  Syncope is when you pass out or faint for a short time. It is caused by a sudden decrease in blood flow to the brain. This can happen for many reasons. It can sometimes happen when seeing blood, getting a shot (injection), or having pain or strong emotions. Most causes of fainting are not dangerous, but in some cases it can be a sign of a serious medical problem. If you faint, get help right away. Call your local emergency services (911 in the [place] Follow these instructions at home: Watch for any changes in your symptoms. Take these actions to stay safe and help with your symptoms: Knowing when you may be about to faint Signs that you may be about to faint include: Feeling dizzy or light-headed. It may feel like the room is spinning. Feeling weak. Feeling like you may vomit (nauseous). Seeing spots or seeing all white or all black. Having cold, clammy skin. Feeling warm and sweaty. Hearing ringing in the ears. If you start to feel like you might faint, sit or lie down right away. If sitting, lower your head down between your legs. If lying down, raise (elevate) your feet above the level of your heart. Breathe deeply and steadily. Wait until all of the symptoms are gone. Have someone stay with you until you feel better. Medicines Take over-the-counter and prescription medicines only as told by your doctor. If you are taking blood pressure or heart medicine, sit up and stand up slowly. Spend a few minutes getting ready to sit and then stand. This can help you feel less dizzy. Lifestyle Do not drive, use machinery, or play sports until your doctor says it is okay. Do not drink alcohol. Do not smoke or use any products that contain nicotine or tobacco. If you need help quitting, ask your doctor. Avoid hot tubs and saunas. General  instructions Talk with your doctor about your symptoms. You may need to have testing to help find the cause. Drink enough fluid to keep your pee (urine) pale yellow. Avoid standing for a long time. If you must stand for a long time, do movements such as: Moving your legs. Crossing your legs. Flexing and stretching your leg muscles. Squatting. Keep all follow-up visits. Contact a doctor if: You have episodes of near fainting. Get help right away if: You pass out or faint. You hit your head or are injured after fainting. You have any of these symptoms: Fast or uneven heartbeats (palpitations). Pain in your chest, belly, or back. Shortness of breath. You have jerky movements that you cannot control (seizure). You have a very bad headache. You are confused. You have problems with how you see (vision). You are very weak. You have trouble walking. You are bleeding from your mouth or your butt (rectum). You have black or tarry poop (stool). These symptoms may be an emergency. Get help right away. Call your local emergency services (911 in the [place] Do not wait to see if the symptoms will go away. Do not drive yourself to the hospital. Summary Syncope is when you pass out or faint for a short time. It is caused by a sudden decrease in blood flow to the brain. Signs that you may be about to faint include feeling dizzy or light-headed, feeling like you may vomit, seeing all white or all  black, or having cold, clammy skin. If you start to feel like you might faint, sit or lie down right away. Lower your head if sitting, or raise (elevate) your feet if lying down. Breathe deeply and steadily. Wait until all of the symptoms are gone. This information is not intended to replace advice given to you by your health care provider. Make sure you discuss any questions you have with your health care provider. Document Revised: 01/20/2021 Document Reviewed: 01/20/2021 Elsevier Patient Education  2024  [employer].

## 2024-10-17 NOTE — Progress Notes (Unsigned)
 EP to read.

## 2024-10-17 NOTE — Progress Notes (Signed)
 "  Acute Office Visit  Subjective:     Patient ID: Alyssa Sparks, female    DOB: 08-24-1991, 34 y.o.   MRN: [id number]  Chief Complaint  Patient presents with   Dizziness    HPI Discussed the use of AI scribe software for clinical note transcription with the patient, who gave verbal consent to proceed.  History of Present Illness Alyssa Sparks is a 34 year old female who presents with dizziness and episodes of syncope.  Dizziness and syncope - Dizziness and syncope ongoing for the past 2-3 weeks - Dizziness begins with sensation of feeling 'hot and clammy', prompting her to sit down - Occasionally, dizziness progresses to syncope lasting 1-2 minutes - During syncope, remains aware of surroundings but feels 'out of it' - Most significant episode occurred at home after picking up children from daycare; did not seek hospital care - Dizziness tends to occur after physical exertion or when emotionally 'worked up' - No room spinning while standing, but experiences it after sitting down - No urinary or bowel incontinence during syncope episodes - Home blood pressure measured at 125/80 mmHg during dizziness - No recent illness, ear pain, or congestion  Hypertension and obstetric history - History of post-eclampsia after last pregnancy, initially resolved - Amlodipine  resumed due to elevated blood pressure during a stressful period - No current symptoms of hypertensive urgency or emergency  Glucose metabolism - History of gestational diabetes with first child, not with second - Last hemoglobin A1c was 5.6 - Does not currently monitor blood glucose at home  Recent infectious illness - Hand, foot, and mouth disease in November; resolved  Lifestyle factors - Works night shifts - Mother of two children    ROS See HPI.      Objective:    BP 134/88   Pulse 74   Ht 5' 7 (1.702 m)   Wt 241 lb 8 oz (109.5 kg)   SpO2 96%   BMI 37.82 kg/m  BP Readings from Last 3  Encounters:  10/17/24 134/88  08/26/24 130/84  06/19/24 134/87   Wt Readings from Last 3 Encounters:  10/17/24 241 lb 8 oz (109.5 kg)  08/26/24 248 lb (112.5 kg)  08/07/24 244 lb (110.7 kg)    Physical Exam Constitutional:      Appearance: Normal appearance. She is obese.  HENT:     Head: Normocephalic.  Cardiovascular:     Rate and Rhythm: Normal rate.  Pulmonary:     Effort: Pulmonary effort is normal.  Musculoskeletal:     Right lower leg: No edema.     Left lower leg: No edema.  Neurological:     General: No focal deficit present.     Mental Status: She is alert and oriented to person, place, and time.     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  Normal Orthostatic BPs in office.          Assessment & Plan:  .Alyssa Sparks was seen today for dizziness.  Diagnoses and all orders for this visit:  Syncope, unspecified syncope type -     CBC w/Diff/Platelet -     TSH + free T4 -     Hemoglobin A1c -     B12 and Folate Panel -     CMP14+EGFR -     Fe+TIBC+Fer -     Continuous Glucose Sensor (FREESTYLE LIBRE 3 PLUS SENSOR) MISC; Change sensor every 15 days. -     LONG TERM MONITOR (3-14 DAYS); Future  Dizziness -     CBC w/Diff/Platelet -     TSH + free T4 -     Hemoglobin A1c -     B12 and Folate Panel -     CMP14+EGFR -     Fe+TIBC+Fer -     Continuous Glucose Sensor (FREESTYLE LIBRE 3 PLUS SENSOR) MISC; Change sensor every 15 days. -     LONG TERM MONITOR (3-14 DAYS); Future  History of gestational diabetes mellitus (GDM) -     Hemoglobin A1c  Essential hypertension -     CMP14+EGFR    Assessment & Plan Recurrent syncope and dizziness with unclear etiology Episodes of syncope with normal orthostatic blood pressures and negative Dix-Hallpike maneuver. Differential includes cardiac arrhythmias, vasovagal syncope, and hypoglycemia. Normal EKG in November, but temporary arrhythmias possible. Gestational diabetes raises concern for hypoglycemia. - Check  TSH, CMP, CBC, Iron panel, B12 for reasons for syncope and dizziness - Ordered Zio patch heart monitor for arrhythmia evaluation. - Ordered labs including A1c for hypoglycemia assessment. - Provided home glucose monitor for symptomatic episodes. - Referred to cardiology. - Consider neurology referral if cardiology is inconclusive.  Essential hypertension Blood pressure well-controlled with amlodipine . Recent reading 125/80 mmHg. - Continue amlodipine .  History of gestational diabetes mellitus Last A1c 5.6%. Concern for hypoglycemia contributing to syncope and dizziness. - Ordered A1c for glycemic control assessment. - Provided home glucose monitor for symptomatic episodes. Place Libre in office today for her and PCP to go over next week.     Return in about 2 weeks (around 10/31/2024).  Brynn Reznik, PA-C   "

## 2024-10-18 LAB — CMP14+EGFR
ALT: 38 [IU]/L — ABNORMAL HIGH (ref 0–32)
AST: 34 [IU]/L (ref 0–40)
Albumin: 4.3 g/dL (ref 3.9–4.9)
Alkaline Phosphatase: 91 [IU]/L (ref 41–116)
BUN/Creatinine Ratio: 13 (ref 9–23)
BUN: 9 mg/dL (ref 6–20)
Bilirubin Total: 0.3 mg/dL (ref 0.0–1.2)
CO2: 21 mmol/L (ref 20–29)
Calcium: 8.8 mg/dL (ref 8.7–10.2)
Chloride: 107 mmol/L — ABNORMAL HIGH (ref 96–106)
Creatinine, Ser: 0.71 mg/dL (ref 0.57–1.00)
Globulin, Total: 2.4 g/dL (ref 1.5–4.5)
Glucose: 89 mg/dL (ref 70–99)
Potassium: 3.7 mmol/L (ref 3.5–5.2)
Sodium: 142 mmol/L (ref 134–144)
Total Protein: 6.7 g/dL (ref 6.0–8.5)
eGFR: 116 mL/min/{1.73_m2}

## 2024-10-18 LAB — CBC WITH DIFFERENTIAL/PLATELET
Basophils Absolute: 0 10*3/uL (ref 0.0–0.2)
Basos: 1 %
EOS (ABSOLUTE): 0.1 10*3/uL (ref 0.0–0.4)
Eos: 1 %
Hematocrit: 38.5 % (ref 34.0–46.6)
Hemoglobin: 11.9 g/dL (ref 11.1–15.9)
Immature Grans (Abs): 0 10*3/uL (ref 0.0–0.1)
Immature Granulocytes: 0 %
Lymphocytes Absolute: 1.7 10*3/uL (ref 0.7–3.1)
Lymphs: 32 %
MCH: 29.8 pg (ref 26.6–33.0)
MCHC: 30.9 g/dL — ABNORMAL LOW (ref 31.5–35.7)
MCV: 96 fL (ref 79–97)
Monocytes Absolute: 0.3 10*3/uL (ref 0.1–0.9)
Monocytes: 6 %
Neutrophils Absolute: 3.2 10*3/uL (ref 1.4–7.0)
Neutrophils: 60 %
Platelets: 284 10*3/uL (ref 150–450)
RBC: 4 x10E6/uL (ref 3.77–5.28)
RDW: 11.2 % — ABNORMAL LOW (ref 11.7–15.4)
WBC: 5.4 10*3/uL (ref 3.4–10.8)

## 2024-10-18 LAB — HEMOGLOBIN A1C
Est. average glucose Bld gHb Est-mCnc: 100 mg/dL
Hgb A1c MFr Bld: 5.1 % (ref 4.8–5.6)

## 2024-10-18 LAB — IRON,TIBC AND FERRITIN PANEL
Ferritin: 117 ng/mL (ref 15–150)
Iron Saturation: 25 % (ref 15–55)
Iron: 69 ug/dL (ref 27–159)
Total Iron Binding Capacity: 281 ug/dL (ref 250–450)
UIBC: 212 ug/dL (ref 131–425)

## 2024-10-18 LAB — B12 AND FOLATE PANEL
Folate: 7.3 ng/mL
Vitamin B-12: 502 pg/mL (ref 232–1245)

## 2024-10-18 LAB — TSH+FREE T4
Free T4: 1.03 ng/dL (ref 0.82–1.77)
TSH: 0.622 u[IU]/mL (ref 0.450–4.500)

## 2024-10-20 ENCOUNTER — Ambulatory Visit: Payer: Self-pay | Admitting: Physician Assistant

## 2024-10-20 NOTE — Progress Notes (Signed)
 Lorra,   Hemoglobin is good.  Kidney function is good.  Thyroid normal.  B12 normal.  Iron panel looks good.  A1C normal range. ALT a little elevated. Avoid alcohol and tylenol  and can recheck in 2 weeks.

## 2024-10-21 ENCOUNTER — Encounter: Admitting: Family Medicine

## 2024-10-21 ENCOUNTER — Other Ambulatory Visit: Payer: Self-pay

## 2024-10-21 DIAGNOSIS — R748 Abnormal levels of other serum enzymes: Secondary | ICD-10-CM

## 2024-10-21 NOTE — Progress Notes (Signed)
Ordered labs per results.  

## 2024-10-29 NOTE — Progress Notes (Unsigned)
" ° °  Complete physical exam  Patient: Alyssa Jacora GaddyFemale    DOB: 02-16-1991 33 y.o.   MRN: [id number]  No chief complaint on file.   Subjective:    Alyssa Sparks is a 34 y.o. female who presents today for a complete physical exam. She reports consuming a {diet types:17450} diet. {types:19826} She generally feels {DESC; WELL/FAIRLY WELL/POORLY:18703}. She reports sleeping {DESC; WELL/FAIRLY WELL/POORLY:18703}. She {does/does not:200015} have additional problems to discuss today.   Discussed the use of AI scribe software for clinical note transcription with the patient, who gave verbal consent to proceed.  History of Present Illness    Most recent fall risk assessment:    10/17/2024    8:58 AM  Fall Risk   Number falls in past yr: 0  Injury with Fall? 0     Most recent depression screenings:    10/17/2024    8:58 AM 08/26/2024   10:46 AM  PHQ 2/9 Scores  PHQ - 2 Score 2 1  PHQ- 9 Score 6 5    {VISON DENTAL STD PSA (Optional):27386} {History (Optional):23778}  Patient Care Team: Alvan Dorothyann BIRCH, MD as PCP - General (Family Medicine) Verta Blossom, CNM as PCP - OBGYN (Obstetrics and Gynecology) Armond Cape, MD as Consulting Physician (Obstetrics and Gynecology)   ROS    Objective:    There were no vitals taken for this visit. {Vitals History (Optional):23777}  Physical Exam  {PhysExam Abridge (Optional):210964309}  No results found for any visits on 10/30/24. {Show previous labs (optional):23779}     Assessment & Plan:    Routine Health Maintenance and Physical Exam Immunization History  Administered Date(s) Administered   Influenza, Seasonal, Injecte, Preservative Fre 07/01/2018, 06/30/2019, 06/28/2021   Influenza,inj,Quad PF,6+ Mos 07/01/2018, 06/26/2019, 06/30/2020, 06/28/2021, 06/28/2022   Influenza,inj,quad, With Preservative 06/25/2020   Influenza-Unspecified 06/25/2024   PFIZER(Purple Top)SARS-COV-2 Vaccination 12/19/2019,  01/09/2020, 07/13/2020   PPD Test 08/12/2014   Tdap 10/28/2020, 05/02/2023    Health Maintenance  Topic Date Due   Cervical Cancer Screening (HPV/Pap Cotest)  03/16/2024   COVID-19 Vaccine (4 - 2025-26 season) 11/02/2024 (Originally 05/26/2024)   Hepatitis B Vaccines 19-59 Average Risk (1 of 3 - 19+ 3-dose series) 10/17/2025 (Originally 04/30/2011)   DTaP/Tdap/Td (3 - Td or Tdap) 05/01/2033   Influenza Vaccine  Completed   HPV VACCINES (No Doses Required) Completed   Hepatitis C Screening  Completed   HIV Screening  Completed   Pneumococcal Vaccine  Aged Out   Meningococcal B Vaccine  Aged Out    Discussed health benefits of physical activity, and encouraged her to engage in regular exercise appropriate for her age and condition.  Problem List Items Addressed This Visit   None   Assessment and Plan Assessment & Plan     No follow-ups on file.    Dorothyann Alvan, MD [facility] Primary Care & [facility] Medicine at [facility]    "

## 2024-10-30 ENCOUNTER — Ambulatory Visit: Admitting: Family Medicine

## 2024-10-30 ENCOUNTER — Encounter: Payer: Self-pay | Admitting: Family Medicine

## 2024-10-30 VITALS — BP 130/84 | HR 75 | Ht 67.0 in | Wt 241.0 lb

## 2024-10-30 DIAGNOSIS — E282 Polycystic ovarian syndrome: Secondary | ICD-10-CM | POA: Diagnosis not present

## 2024-10-30 DIAGNOSIS — Z0000 Encounter for general adult medical examination without abnormal findings: Secondary | ICD-10-CM

## 2024-10-30 DIAGNOSIS — L309 Dermatitis, unspecified: Secondary | ICD-10-CM | POA: Diagnosis not present

## 2024-10-30 DIAGNOSIS — R7401 Elevation of levels of liver transaminase levels: Secondary | ICD-10-CM | POA: Diagnosis not present

## 2024-10-30 MED ORDER — ESCITALOPRAM OXALATE 20 MG PO TABS: 20.0000 mg | ORAL_TABLET | Freq: Every day | ORAL | 1 refills | Status: AC

## 2024-10-30 MED ORDER — TRIAMCINOLONE ACETONIDE 0.1 % EX CREA: 1.0000 | TOPICAL_CREAM | Freq: Two times a day (BID) | CUTANEOUS | 0 refills | Status: AC

## 2024-10-30 MED ORDER — METFORMIN HCL ER 500 MG PO TB24: 500.0000 mg | ORAL_TABLET | Freq: Every day | ORAL | 1 refills | Status: AC

## 2024-10-30 NOTE — Assessment & Plan Note (Signed)
 Recommend restart metformin . Trial of ER since had diarrhea on short acting version. Has acanthosis on exam.

## 2024-10-31 ENCOUNTER — Ambulatory Visit: Payer: Self-pay | Admitting: Family Medicine

## 2024-10-31 LAB — LIPID PANEL
Chol/HDL Ratio: 3.5 ratio (ref 0.0–4.4)
Cholesterol, Total: 131 mg/dL (ref 100–199)
HDL: 37 mg/dL — ABNORMAL LOW
LDL Chol Calc (NIH): 79 mg/dL (ref 0–99)
Triglycerides: 73 mg/dL (ref 0–149)
VLDL Cholesterol Cal: 15 mg/dL (ref 5–40)

## 2024-10-31 LAB — HEPATIC FUNCTION PANEL
ALT: 8 [IU]/L (ref 0–32)
AST: 17 [IU]/L (ref 0–40)
Albumin: 4.3 g/dL (ref 3.9–4.9)
Alkaline Phosphatase: 91 [IU]/L (ref 41–116)
Bilirubin Total: 0.3 mg/dL (ref 0.0–1.2)
Bilirubin, Direct: 0.12 mg/dL (ref 0.00–0.40)
Total Protein: 7.2 g/dL (ref 6.0–8.5)

## 2024-10-31 NOTE — Progress Notes (Signed)
 Hi Alyssa Sparks, total cholesterol and LDL looks good.  Your HDL which is the good kind is a little on the low end.  Regular exercise will help bring that up.  Liver enzymes are back to normal which is great.
